# Patient Record
Sex: Female | Born: 1975 | Race: Black or African American | Hispanic: No | Smoking: Current every day smoker
Health system: Southern US, Community
[De-identification: ages and names within clinical notes are randomized; demographics above are authoritative.]

## PROBLEM LIST (undated history)

## (undated) DIAGNOSIS — R519 Headache, unspecified: Secondary | ICD-10-CM

## (undated) DIAGNOSIS — G43019 Migraine without aura, intractable, without status migrainosus: Principal | ICD-10-CM

## (undated) DIAGNOSIS — R51 Headache: Secondary | ICD-10-CM

## (undated) DIAGNOSIS — N289 Disorder of kidney and ureter, unspecified: Secondary | ICD-10-CM

## (undated) DIAGNOSIS — Z87442 Personal history of urinary calculi: Secondary | ICD-10-CM

## (undated) HISTORY — DX: Migraine without aura, intractable, without status migrainosus: G43.019

## (undated) HISTORY — DX: Headache, unspecified: R51.9

## (undated) HISTORY — DX: Headache: R51

---

## 1998-08-11 ENCOUNTER — Emergency Department (HOSPITAL_COMMUNITY): Admission: EM | Admit: 1998-08-11 | Discharge: 1998-08-11 | Payer: Self-pay | Admitting: Emergency Medicine

## 1999-02-22 ENCOUNTER — Other Ambulatory Visit: Admission: RE | Admit: 1999-02-22 | Discharge: 1999-02-22 | Payer: Self-pay | Admitting: Obstetrics and Gynecology

## 1999-06-26 ENCOUNTER — Inpatient Hospital Stay (HOSPITAL_COMMUNITY): Admission: AD | Admit: 1999-06-26 | Discharge: 1999-06-26 | Payer: Self-pay | Admitting: Obstetrics and Gynecology

## 1999-06-29 ENCOUNTER — Inpatient Hospital Stay (HOSPITAL_COMMUNITY): Admission: AD | Admit: 1999-06-29 | Discharge: 1999-06-29 | Payer: Self-pay | Admitting: Obstetrics & Gynecology

## 1999-07-15 ENCOUNTER — Inpatient Hospital Stay (HOSPITAL_COMMUNITY): Admission: AD | Admit: 1999-07-15 | Discharge: 1999-07-17 | Payer: Self-pay | Admitting: Obstetrics & Gynecology

## 1999-07-15 ENCOUNTER — Encounter (INDEPENDENT_AMBULATORY_CARE_PROVIDER_SITE_OTHER): Payer: Self-pay

## 2000-08-30 ENCOUNTER — Other Ambulatory Visit: Admission: RE | Admit: 2000-08-30 | Discharge: 2000-08-30 | Payer: Self-pay | Admitting: Obstetrics and Gynecology

## 2001-02-01 ENCOUNTER — Inpatient Hospital Stay (HOSPITAL_COMMUNITY): Admission: AD | Admit: 2001-02-01 | Discharge: 2001-02-01 | Payer: Self-pay | Admitting: Obstetrics and Gynecology

## 2001-02-14 ENCOUNTER — Inpatient Hospital Stay (HOSPITAL_COMMUNITY): Admission: AD | Admit: 2001-02-14 | Discharge: 2001-02-14 | Payer: Self-pay | Admitting: Obstetrics and Gynecology

## 2001-02-16 ENCOUNTER — Encounter (INDEPENDENT_AMBULATORY_CARE_PROVIDER_SITE_OTHER): Payer: Self-pay | Admitting: Specialist

## 2001-02-16 ENCOUNTER — Inpatient Hospital Stay (HOSPITAL_COMMUNITY): Admission: AD | Admit: 2001-02-16 | Discharge: 2001-02-19 | Payer: Self-pay | Admitting: Obstetrics and Gynecology

## 2001-09-14 ENCOUNTER — Emergency Department (HOSPITAL_COMMUNITY): Admission: EM | Admit: 2001-09-14 | Discharge: 2001-09-14 | Payer: Self-pay | Admitting: Emergency Medicine

## 2001-09-14 ENCOUNTER — Encounter: Payer: Self-pay | Admitting: Emergency Medicine

## 2004-08-16 ENCOUNTER — Emergency Department (HOSPITAL_COMMUNITY): Admission: EM | Admit: 2004-08-16 | Discharge: 2004-08-16 | Payer: Self-pay | Admitting: Emergency Medicine

## 2005-07-09 ENCOUNTER — Emergency Department (HOSPITAL_COMMUNITY): Admission: EM | Admit: 2005-07-09 | Discharge: 2005-07-09 | Payer: Self-pay | Admitting: Emergency Medicine

## 2008-04-08 ENCOUNTER — Emergency Department (HOSPITAL_COMMUNITY): Admission: EM | Admit: 2008-04-08 | Discharge: 2008-04-08 | Payer: Self-pay | Admitting: *Deleted

## 2009-09-29 ENCOUNTER — Emergency Department (HOSPITAL_COMMUNITY): Admission: EM | Admit: 2009-09-29 | Discharge: 2009-09-29 | Payer: Self-pay | Admitting: Emergency Medicine

## 2010-05-04 LAB — POCT I-STAT, CHEM 8
BUN: 7 mg/dL (ref 6–23)
Calcium, Ion: 1.12 mmol/L (ref 1.12–1.32)
Chloride: 109 mEq/L (ref 96–112)
Creatinine, Ser: 0.6 mg/dL (ref 0.4–1.2)
Glucose, Bld: 135 mg/dL — ABNORMAL HIGH (ref 70–99)
HCT: 42 % (ref 36.0–46.0)
Hemoglobin: 14.3 g/dL (ref 12.0–15.0)
Potassium: 3.5 mEq/L (ref 3.5–5.1)
Sodium: 140 mEq/L (ref 135–145)
TCO2: 22 mmol/L (ref 0–100)

## 2010-06-09 NOTE — H&P (Signed)
[facility]  Patient:    Barbara Caldwell, Barbara Caldwell                         MRN: [id number] Adm. Date:  [id number] Disc. Date: [id number] Attending:  Cleatrice Burke Dictator:   Erin Sons, C.N.M.                         History and Physical  DATE OF BIRTH:  01/29/1975  HISTORY OF PRESENT ILLNESS:   Ms. Weedon is a 35 year old, gravida 4, para 2-0-1-2, at 36-6/7 weeks who presents with uterine contractions every one to three minutes x 0 hours.  She denies any leaking or bleeding and reports positive fetal movement.  Pregnancy has remarkable for: (1) Positive group B strep, (2) Previous smoker.  PRENATAL LABORATORY DATA:     Blood type is O positive.  Rh antibody negative. VDRL nonreactive.  Rubella titer positive.  Hepatitis B surface antigen negative.  Sickle cell test negative.  GC and Chlamydia cultures were negative.  Pap was normal.  HIV was declined.  AFP was declined.  Glucose challenge was normal.  She did have a positive urine culture on initially urine sample in the first trimester.  Hemoglobin upon entry into practice was 12.2, it was 10.3 at 30 weeks.  EDC of August 06, 1999, was established by ultrasound at approximately 17 weeks.  At that time she did have a placenta previa but it did resolve itself.  This was repeated at 26 weeks and was negative.  Group B strep culture was positive at 36 weeks.  HISTORY OF PRESENT PREGNANCY:                    The patient entered care at approximately 14 weeks.  She had a positive beta strep diagnosed on urine culture.  She had BD at that time.  She had an ultrasound at 17 weeks and at 26 weeks to follow up on the resolved previa.  Estimated fetal weight remained within normal limits throughout those ultrasounds.  The rest of her pregnancy was essentially uncomplicated.  OBSTETRICAL HISTORY:          In 1995, she had a vaginal birth of a female infant, weight 6 pounds 9 ounces at [redacted] weeks gestation.   She was in labor approximately 16 hours.  She had epidural anesthesia.  During that pressure, she did have decreased amniotic fluid volume.  In 1996 she had a first trimester of spontaneous miscarriage with a D&C.  In 1998 she had a vaginal birth of a female infant, weight 6 pounds 11 ounces at [redacted] weeks gestation. She was in labor approximately 6 hours.  She had epidural anesthesia.  She was induced for post dates.  There was a nuchal cord noted.  She had anemia during those previous pregnancies.  She also had yeast infections during those pregnancies.  PAST MEDICAL HISTORY:         She was treated for syphilis in 1990.  She reports the usual childhood illnesses.  She had a UTI during her 1995 pregnancy.  She was a smoker prior to pregnancy.  At age 67 she cracked her pelvis with a fall.  Her only other surgery was a 1996 D&C.  FAMILY HISTORY:               Her cousin had pregnancy-induced hypertension. Her maternal uncles have heart disease and are  now deceased.  Her father has hypertension.  Her sister has anemia.  Her nephews have asthma.  Her maternal aunt has insulin-dependent diabetes.  Her maternal cousin has thyroid problems.  Maternal grandmother had cervical cancer.  Maternal aunt had breast cancer.  GENETIC HISTORY:              Remarkable for patient being a fraternal twin.  SOCIAL HISTORY:               The patient is separated from her husband.  The father of the baby is involved.  She is African-American, of the [place] faith.  She has been followed by the Certified Nurse-Midwife [facility].  She denies any alcohol or drug use during this pregnancy.  She was a smoker prior to her urine pregnancy test.  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  PHYSICAL EXAMINATION:  VITAL SIGNS:                  Vital signs are stable.  The patient is afebrile.  HEENT:                        Within normal limits.  LUNGS:                         Bilateral breath sounds are clear.  HEART:                        Regular rate and rhythm without murmur.  BREASTS:                      Soft and nontender.  ABDOMEN:                      Fundal height is approximately 37 cm.  Estimated fetal weight is 6 to 6-1/2 pounds.  Uterine contractions are every 3-5 minutes, mild to moderate quality.  PELVIC:                       Cervical exam is 3-4 cm, 80% vertex at a -1 station with bulging bag of waters.  This is a change from 2 cm early in the office this week.  EXTREMITIES:                  Deep tendon reflexes are 2+ without clonus. There is a trace edema noted.  Fetal heart rate is reactive with no decelerations. ASSESSMENT:                   1. Intrauterine pregnancy, at 36-6/7 weeks.                               2. Early labor.                               3. Positive group B streptococcus.  PLAN:                         1. Admit to birthing suite for consult with  Dr. Cleatrice Burke, who is attending                                  physician.                               2. Routine Certified Nurse-Midwife orders.                               3. Plan group B streptococcus prophylaxis of                                  penicillin G per standard dosing.                               4. Anticipate normal spontaneous vaginal                                  birth. DD:  07/15/99 TD:  07/15/99 Job: [id number]

## 2010-06-09 NOTE — Op Note (Signed)
[facility]  Patient:    KEARI, MIU Visit Number: [id number] MRN: [id number]          Service Type: OBS Location: 9400 [id number] Attending Physician:  Jaymes Graff A Dictated by:   Pierre Bali. Normand Sloop, M.D. Proc. Date: 02/16/01 Admit Date:  02/16/2001                             Operative Report  PREOPERATIVE DIAGNOSIS:       Placental abruption.  POSTOPERATIVE DIAGNOSIS:      Placental abruption.  PROCEDURE:                    Stat low transverse cesarean section.  SURGEON:                      Naima A. Normand Sloop, M.D.  ASSISTANT:                    Caralyn Guile. Arlyce Dice, M.D.  ANESTHESIA:                   General endotracheal.  ESTIMATED BLOOD LOSS:         2000 cc.  IV FLUIDS:                    4000 cc crystalloid.  URINE OUTPUT:                 100 cc clear urine at the end of the procedure.  FINDINGS:                     Stillborn female fetus with Apgars of 0, 0 and 0.  Approximately 1000 cc of blood clot seen in the uterus consistent with abruption.  Normal tubes and ovaries.  Normal abdominal anatomy.  PROCEDURE IN DETAIL:          The patient was taken to the operating room, where she was given general anesthesia.  Stat prep was done and a Pfannenstiel skin incision was then made and carried down to the fascia using a knife.  The fascia was then incised in the midline and extended bilaterally.  The rectus muscles were then separated.  The peritoneum was identified, tented up and entered sharply.  A bladder blade was inserted.  A primary low transverse uterine incision was made with a knife and extended bluntly.  The infant was delivered.  All this was done by Dr. Arlyce Dice, and I was able to make it to the hospital to assist.  The placenta was already separated and delivered easily. The uterus was cleared of all clot and debris.  The uterine incision was repaired with 0 Vicryl in a running lock fashion.  Hemostasis was assured.  The abdomen was irrigated with copious normal saline.  The fascia was closed with 0 Vicryl in a running fashion.  The skin was closed with staples.  Sponge, lap, and needle counts were correct x 2.  The patient went to the recovery room in stable condition. Dictated by:   Pierre Bali. Normand Sloop, M.D. Attending Physician:  Michael Litter DD:  02/16/01 TD:  02/17/01 Job: [id number]

## 2010-06-09 NOTE — Discharge Summary (Signed)
[facility]  Patient:    Barbara Caldwell, Barbara Caldwell Visit Number: [id number] MRN: [id number]          Service Type: OBS Location: 9300 [id number] Attending Physician:  Jaymes Graff A Dictated by:   Mack Guise, C.N.M. Admit Date:  02/16/2001 Discharge Date: 02/19/2001                             Discharge Summary  ADMISSION DIAGNOSES: 1. Intrauterine pregnancy at 35-4/7 weeks. 2. Abruptio placenta. 3. IUFD.  PROCEDURE:  Primary low transverse cesarean section stat.  DISCHARGE DIAGNOSES: 1. Intrauterine pregnancy at 35-4/7 weeks. 2. Abruptio placenta. 3. IUFD. 4. Diffuse intravascular coagulation.  HISTORY OF PRESENT ILLNESS AND HOSPITAL COURSE:  Barbara Caldwell is a 35 year old African-American, gravida 5, para 3-0-1-3, who presented by EMS at 35-4/7 weeks with severe abdominal pain and a rigid abdomen.  Fetal distress was noted, and it was decided to proceed with stat low transverse cesarean section.  The baby was delivered by Dr. Arlyce Dice who answered the call for any obstetrician to come to triage as soon as possible.  A stillborn fetus was found with approximately 1000 cc of blood and clots in the uterus, consistent with abruption.  The patient has done as well as can be expected in the immediate postoperative period.  Her blood values indicated DIC, PT was 25.6, INR 3.1, PTT 55.1.  Her hemoglobin was 5.4, and decision was made to give 2 units of red blood cells and 4 units of fresh frozen plasma.  The patient was followed very closely, and did receive 2 further units of packed red blood cells.  Her blood values then fell into the normal range by the next postoperative day.  Her white blood cell count came down to 13.2 from 21.6. Her hemoglobin stabilized at 7.6.  Platelets on that day were 85,000, and on the day of discharge were 124,000.  PT stabilized at 14.0, INR at 1.1, PTT 30, fibrinogen 317, and D-dimer 10.45.  Her vital signs have remained  stable.  She is afebrile.  She is ambulating without difficulty, and states her desire to be discharged home.  On her third postoperative day she is judged to be in satisfactory condition for discharge.  LABORATORY DATA:  Her urine drug screen was found to be positive for benzodiazepines, barbituates, and cannabinoids.  DISCHARGE INSTRUCTIONS:  Per [facility] handout.  DISCHARGE MEDICATIONS: 1. Motrin 600 mg p.o. q.6h. p.r.n. pain. 2. Tylox one or two p.o. q.3-4h. p.r.n. pain. 3. Prenatal vitamins.  FOLLOWUP:  [facility] in four weeks, and plans a bilateral tubal sterilization at six weeks. Dictated by:   Mack Guise, C.N.M. Attending Physician:  Michael Litter DD:  02/19/01 TD:  02/19/01 Job: [id number]

## 2010-06-09 NOTE — H&P (Signed)
[facility]  Patient:    PHEBE, DETTMER Visit Number: [id number] MRN: [id number]          Service Type: OBS Location: [phone] Attending Physician:  Jaymes Graff A Dictated by:   Pierre Bali. Normand Sloop, M.D. Admit Date:  02/16/2001                           History and Physical  HISTORY OF PRESENT ILLNESS:   The patient is a 35 year old African American female, G5, P3-0-1-3, whose last menstrual period was May 31, 2000, with a due date of March 20, 2001, at 35-4/7th weeks who presented by EMS with a blood pressure of 90/50, severe abdominal pain, and rigid abdomen.  I was paged because I was not at the hospital at the time, and I asked for the nurses to call for the first available physician.  Dr. Arlyce Dice responded to the call and said that he saw the patient, and did break the water, and found it was clear, but had a heart beat of 70 which could be maternal, and then proceeded with an ultrasound which looked like the baby had _______ heart beat.  At this point, it was decided to proceed with a stat low transverse cesarean section.  The baby was already delivered by Dr. Arlyce Dice, and I arrived and proceed to help him repair the uterus.  PAST MEDICAL HISTORY:         The patient has no significant past medical or surgical history.  ALLERGIES:                    No known drug allergies.  MEDICATIONS:                  Include prenatal vitamins and Ambien.  SOCIAL HISTORY:               Significant for tobacco use.  She denies any drug use.  PAST OB HISTORY:              Significant for full-term vaginal delivery x3 without any complications and one miscarriage in 1996, in which she had a D&E.  PRENATAL LABORATORY DATA:     Were 12.7 and 38.5 hemoglobin, platelets are 358.  Blood type is O-positive.  Antibody negative.  She is rubella immune. RPR was nonreactive.  Hepatitis B surface antigen was negative.  Pap was within normal range.  GC and  Chlamydia negative.  A one hour glucola was 64.  PHYSICAL EXAMINATION:  VITAL SIGNS:                  The patients blood pressure now is 116/51, pulse 89, temperature is 32 degrees.  GENERAL:                      The patient is in mild distress.  LUNGS:                        Clear.  ABDOMEN:                      Soft and nontender.  EXTREMITIES:                  Had no cyanosis, clubbing, or edema.  ASSESSMENT:  Her stat hemoglobin sent was 8.2.  The patient will be sent to the _____ instead of the ______ and complete metabolic panel, two units of blood will be typed and crossed in case she needs blood transfusion.  We sent for a urine drug screen.  The patient consented to the procedure and morphine PCA.  The patient was told to monitor closely and a Bear Hugger was placed to monitor her temperature. Dictated by:   Pierre Bali. Normand Sloop, M.D. Attending Physician:  Michael Litter DD:  02/16/01 TD:  02/16/01 Job: [id number]

## 2012-12-04 ENCOUNTER — Emergency Department (HOSPITAL_COMMUNITY): Payer: Medicaid Other

## 2012-12-04 ENCOUNTER — Emergency Department (HOSPITAL_COMMUNITY)
Admission: EM | Admit: 2012-12-04 | Discharge: 2012-12-04 | Disposition: A | Discharge status: Home / Self Care | Payer: Medicaid Other | Attending: Emergency Medicine | Admitting: Emergency Medicine

## 2012-12-04 ENCOUNTER — Encounter (HOSPITAL_COMMUNITY): Payer: Self-pay | Admitting: Emergency Medicine

## 2012-12-04 DIAGNOSIS — M25552 Pain in left hip: Secondary | ICD-10-CM

## 2012-12-04 DIAGNOSIS — S6390XA Sprain of unspecified part of unspecified wrist and hand, initial encounter: Secondary | ICD-10-CM | POA: Insufficient documentation

## 2012-12-04 DIAGNOSIS — S6391XA Sprain of unspecified part of right wrist and hand, initial encounter: Secondary | ICD-10-CM

## 2012-12-04 DIAGNOSIS — F17200 Nicotine dependence, unspecified, uncomplicated: Secondary | ICD-10-CM | POA: Insufficient documentation

## 2012-12-04 DIAGNOSIS — Y929 Unspecified place or not applicable: Secondary | ICD-10-CM | POA: Insufficient documentation

## 2012-12-04 DIAGNOSIS — T148XXA Other injury of unspecified body region, initial encounter: Secondary | ICD-10-CM

## 2012-12-04 DIAGNOSIS — M25559 Pain in unspecified hip: Secondary | ICD-10-CM | POA: Insufficient documentation

## 2012-12-04 DIAGNOSIS — Y9389 Activity, other specified: Secondary | ICD-10-CM | POA: Insufficient documentation

## 2012-12-04 DIAGNOSIS — X500XXA Overexertion from strenuous movement or load, initial encounter: Secondary | ICD-10-CM | POA: Insufficient documentation

## 2012-12-04 DIAGNOSIS — Y990 Civilian activity done for income or pay: Secondary | ICD-10-CM | POA: Insufficient documentation

## 2012-12-04 MED ORDER — METHOCARBAMOL 500 MG PO TABS: 500.0000 mg | ORAL_TABLET | Freq: Two times a day (BID) | ORAL | Status: DC

## 2012-12-04 NOTE — ED Notes (Signed)
Pt c/o of right hand pain. Swelling unable to make a fist. Also c/o of left hip pain, sharp 10/10 sudden. Denies injury.

## 2012-12-04 NOTE — ED Provider Notes (Signed)
  Medical screening examination/treatment/procedure(s) were performed by non-physician practitioner and as supervising physician I was immediately available for consultation/collaboration.      Gerhard Munch, MD 12/04/12 2010

## 2012-12-04 NOTE — ED Provider Notes (Signed)
CSN: [id number]     Arrival date & time 12/04/12  1517 History   First MD Initiated Contact with Patient 12/04/12 1530     Chief Complaint  Patient presents with  . Hip Pain  . Hand Pain   (Consider location/radiation/quality/duration/timing/severity/associated sxs/prior Treatment) HPI Comments: Patient is a 37 year old healthy female who presents to the emergency department complaining of right hand pain intermittently x2 months, worsening over the past 2 days. Patient states 2 months back, she lifted a heavy box at work causing hand pain, 2 days ago again while at work and lifting a box she felt her hand pop and pull, has been swelling since. Pain worse with grabbing or putting pressure on her index and middle finger. She has not tried any alleviating factors for her pain. Also complaining of left hip pain x2 days. No known injury or trauma. Describes the pain as sharp, occasionally radiating down the front about halfway down her thigh rated 10/10. Laying on her left side makes the pain worse. She has not tried any alleviating factors.denies back pain or injury, difficulty walking, pelvic pain, fever, chills, night sweats, numbness or tingling.  Patient is a 37 y.o. female presenting with hip pain and hand pain. The history is provided by the patient.  Hip Pain Associated symptoms include joint swelling.  Hand Pain Associated symptoms include joint swelling.    History reviewed. No pertinent past medical history. History reviewed. No pertinent past surgical history. No family history on file. History  Substance Use Topics  . Smoking status: Current Every Day Smoker  . Smokeless tobacco: Not on file  . Alcohol Use: No   OB History   Grav Para Term Preterm Abortions TAB SAB Ect Mult Living                 Review of Systems  Musculoskeletal: Positive for joint swelling.       Positive for right hand and left hip pain.  All other systems reviewed and are negative.    Allergies   Review of patient's allergies indicates no known allergies.  Home Medications   Current Outpatient Rx  Name  Route  Sig  Dispense  Refill  . ibuprofen (ADVIL,MOTRIN) 200 MG tablet   Oral   Take 600 mg by mouth every 6 (six) hours as needed.          BP 125/83  Pulse 69  Temp(Src) 98.6 F (37 C) (Oral)  Resp 18  SpO2 99% Physical Exam  Nursing note and vitals reviewed. Constitutional: She is oriented to person, place, and time. She appears well-developed and well-nourished. No distress.  HENT:  Head: Normocephalic and atraumatic.  Mouth/Throat: Oropharynx is clear and moist.  Eyes: Conjunctivae are normal.  Neck: Normal range of motion. Neck supple.  Cardiovascular: Normal rate, regular rhythm, normal heart sounds and intact distal pulses.   Capillary refill < 3 seconds.  Pulmonary/Chest: Effort normal and breath sounds normal.  Abdominal: Soft. Bowel sounds are normal. She exhibits no distension. There is no tenderness.  Musculoskeletal: Normal range of motion.  TTP of right 2nd and 3rd metatarsals with mild swelling, no deformity, full ROM or right hand and wrist. No snuffbox tenderness. TTP of lateral insertion of left quadriceps muscle. No edema. Normal gait. Full L hip ROM, minimal pain. No lumbar spine or muscular tenderness.  Neurological: She is alert and oriented to person, place, and time.  Sensation intact.  Skin: Skin is warm and dry. She is not diaphoretic.  Psychiatric: She has a normal mood and affect. Her behavior is normal.    ED Course  Procedures (including critical care time) Labs Review Labs Reviewed - No data to display Imaging Review Dg Hand Complete Right  12/04/2012   CLINICAL DATA:  Right hand pain  EXAM: RIGHT HAND - COMPLETE 3+ VIEW  COMPARISON:  None.  FINDINGS: There is no evidence of fracture or dislocation. There is no evidence of arthropathy or other focal bone abnormality. Soft tissues are unremarkable.  IMPRESSION: No acute  abnormality noted.   Electronically Signed   By: Alcide Clever M.D.   On: 12/04/2012 16:14    EKG Interpretation   None       MDM   1. Hand sprain, right, initial encounter   2. Hip pain, left   3. Muscle strain    Xrays without acute finding. Neurovascularly intact. No back pain, neuro deficits, abdominal pain. Ambulating without difficulty. F/u with PCP. NSAIDs, robaxin, ice/heat. Return precautions given. Patient states understanding of treatment care plan and is agreeable.    Trevor Mace, PA-C 12/04/12 [phone]

## 2013-02-05 ENCOUNTER — Ambulatory Visit
Admission: RE | Admit: 2013-02-05 | Discharge: 2013-02-05 | Disposition: A | Discharge status: Home / Self Care | Payer: Medicaid Other | Source: Ambulatory Visit | Attending: Family Medicine | Admitting: Family Medicine

## 2013-02-05 ENCOUNTER — Other Ambulatory Visit: Payer: Self-pay | Admitting: Family Medicine

## 2013-02-05 DIAGNOSIS — W19XXXA Unspecified fall, initial encounter: Secondary | ICD-10-CM

## 2013-03-05 ENCOUNTER — Emergency Department (HOSPITAL_COMMUNITY)
Admission: EM | Admit: 2013-03-05 | Discharge: 2013-03-05 | Disposition: A | Discharge status: Home / Self Care | Payer: Medicaid Other | Attending: Emergency Medicine | Admitting: Emergency Medicine

## 2013-03-05 ENCOUNTER — Encounter (HOSPITAL_COMMUNITY): Payer: Self-pay | Admitting: Emergency Medicine

## 2013-03-05 DIAGNOSIS — F17200 Nicotine dependence, unspecified, uncomplicated: Secondary | ICD-10-CM | POA: Insufficient documentation

## 2013-03-05 DIAGNOSIS — G43909 Migraine, unspecified, not intractable, without status migrainosus: Secondary | ICD-10-CM | POA: Insufficient documentation

## 2013-03-05 MED ORDER — METOCLOPRAMIDE HCL 5 MG/ML IJ SOLN
10.0000 mg | Freq: Once | INTRAMUSCULAR | Status: AC
  Administered 2013-03-05: 10 mg via INTRAVENOUS
  Filled 2013-03-05: qty 2

## 2013-03-05 MED ORDER — DIPHENHYDRAMINE HCL 50 MG/ML IJ SOLN
25.0000 mg | Freq: Once | INTRAMUSCULAR | Status: AC
  Administered 2013-03-05: 25 mg via INTRAVENOUS
  Filled 2013-03-05: qty 1

## 2013-03-05 MED ORDER — MAGNESIUM SULFATE 40 MG/ML IJ SOLN
2.0000 g | Freq: Once | INTRAMUSCULAR | Status: AC
  Administered 2013-03-05: 2 g via INTRAVENOUS
  Filled 2013-03-05: qty 50

## 2013-03-05 MED ORDER — DEXAMETHASONE SODIUM PHOSPHATE 4 MG/ML IJ SOLN
10.0000 mg | Freq: Once | INTRAMUSCULAR | Status: AC
  Administered 2013-03-05: 10 mg via INTRAVENOUS
  Filled 2013-03-05 (×2): qty 3

## 2013-03-05 MED ORDER — SODIUM CHLORIDE 0.9 % IV BOLUS (SEPSIS)
1000.0000 mL | Freq: Once | INTRAVENOUS | Status: AC
  Administered 2013-03-05: 1000 mL via INTRAVENOUS

## 2013-03-05 NOTE — ED Provider Notes (Signed)
CSN: [id number]     Arrival date & time 03/05/13  0441 History   First MD Initiated Contact with Patient 03/05/13 [phone]     Chief Complaint  Patient presents with  . Migraine     (Consider location/radiation/quality/duration/timing/severity/associated sxs/prior Treatment) HPI History provided by patient. Has history of migraine headaches and will this morning with gradual onset migraine worse or lasts a few hours associated nausea. Some photo phobia. Pain radiates from right temporal area of her right. No neck stiffness. Pain is throbbing in quality moderate to severe. Feels like a typical migraine for her. No fevers or chills. No weakness or numbness. History reviewed. No pertinent past medical history. History reviewed. No pertinent past surgical history. No family history on file. History  Substance Use Topics  . Smoking status: Current Every Day Smoker  . Smokeless tobacco: Not on file  . Alcohol Use: Yes     Comment: daily   OB History   Grav Para Term Preterm Abortions TAB SAB Ect Mult Living                 Review of Systems    Allergies  Review of patient's allergies indicates no known allergies.  Home Medications   Current Outpatient Rx  Name  Route  Sig  Dispense  Refill  . ibuprofen (ADVIL,MOTRIN) 200 MG tablet   Oral   Take 600 mg by mouth every 6 (six) hours as needed.          BP 146/97  Pulse 77  Temp(Src) 97.5 F (36.4 C) (Oral)  Resp 16  Ht 5\' 2"  (1.575 m)  Wt 130 lb (58.968 kg)  BMI 23.77 kg/m2  SpO2 97%  LMP 03/02/2013 Physical Exam  Constitutional: She is oriented to person, place, and time. She appears well-developed and well-nourished.  HENT:  Head: Normocephalic and atraumatic.  No tenderness over TMJ or temporal artery  Eyes: Conjunctivae and EOM are normal. Pupils are equal, round, and reactive to light.  Neck: Full passive range of motion without pain. Neck supple. No thyromegaly present.  No meningismus  Cardiovascular: Normal  rate, regular rhythm, S1 normal, S2 normal and intact distal pulses.   Pulmonary/Chest: Effort normal and breath sounds normal. No respiratory distress. She exhibits no tenderness.  Abdominal: Soft. Bowel sounds are normal. She exhibits no distension. There is no tenderness. There is no CVA tenderness.  Musculoskeletal: Normal range of motion. She exhibits no edema.  Neurological: She is alert and oriented to person, place, and time. She has normal strength and normal reflexes. No cranial nerve deficit or sensory deficit. She displays a negative Romberg sign. GCS eye subscore is 4. GCS verbal subscore is 5. GCS motor subscore is 6.  Normal Gait  Skin: Skin is warm and dry. No rash noted. No cyanosis. Nails show no clubbing.  Psychiatric: She has a normal mood and affect. Her speech is normal and behavior is normal.    ED Course  Procedures (including critical care time) Labs Review Labs Reviewed - No data to display Imaging Review No results found.  IV fluids and IV headache cocktail provided: Benadryl, Decadron, Reglan, magnesium  On recheck headache improved, requesting a little more pain medication. IV Reglan repeated.  Patient feels comfortable to plan discharge home, followup primary care physician. Return precautions verbalizes understood. MDM   Diagnosis: Headache, history of migraines  No neuro deficits or indications for CT head or LP Improved with medications/ serial evaluations    Sunnie NielsenBrian Leora Platt, MD 03/05/13  0748 

## 2013-03-05 NOTE — ED Notes (Signed)
Pt c/o R sided HA onset 0100, radiating into neck. +n/v. +photosensitivity

## 2013-03-05 NOTE — ED Notes (Signed)
Pt states woke up at 0100 with a migraine, hx of migraines, states having n/v, sensitivity to light, denies sensitivity to sound.

## 2013-03-05 NOTE — Discharge Instructions (Signed)
Migraine Headache A migraine headache is an intense, throbbing pain on one or both sides of your head. A migraine can last for 30 minutes to several hours. CAUSES  The exact cause of a migraine headache is not always known. However, a migraine may be caused when nerves in the brain become irritated and release chemicals that cause inflammation. This causes pain. Certain things may also trigger migraines, such as:  Alcohol.  Smoking.  Stress.  Menstruation.  Aged cheeses.  Foods or drinks that contain nitrates, glutamate, aspartame, or tyramine.  Lack of sleep.  Chocolate.  Caffeine.  Hunger.  Physical exertion.  Fatigue.  Medicines used to treat chest pain (nitroglycerine), birth control pills, estrogen, and some blood pressure medicines. SIGNS AND SYMPTOMS  Pain on one or both sides of your head.  Pulsating or throbbing pain.  Severe pain that prevents daily activities.  Pain that is aggravated by any physical activity.  Nausea, vomiting, or both.  Dizziness.  Pain with exposure to bright lights, loud noises, or activity.  General sensitivity to bright lights, loud noises, or smells. Before you get a migraine, you may get warning signs that a migraine is coming (aura). An aura may include:  Seeing flashing lights.  Seeing bright spots, halos, or zig-zag lines.  Having tunnel vision or blurred vision.  Having feelings of numbness or tingling.  Having trouble talking.  Having muscle weakness. DIAGNOSIS  A migraine headache is often diagnosed based on:  Symptoms.  Physical exam.  A CT scan or MRI of your head. These imaging tests cannot diagnose migraines, but they can help rule out other causes of headaches. TREATMENT Medicines may be given for pain and nausea. Medicines can also be given to help prevent recurrent migraines.  HOME CARE INSTRUCTIONS  Only take over-the-counter or prescription medicines for pain or discomfort as directed by your  health care provider. The use of long-term narcotics is not recommended.  Lie down in a dark, quiet room when you have a migraine.  Keep a journal to find out what may trigger your migraine headaches. For example, write down:  What you eat and drink.  How much sleep you get.  Any change to your diet or medicines.  Limit alcohol consumption.  Quit smoking if you smoke.  Get 7 9 hours of sleep, or as recommended by your health care provider.  Limit stress.  Keep lights dim if bright lights bother you and make your migraines worse. SEEK IMMEDIATE MEDICAL CARE IF:   Your migraine becomes severe.  You have a fever.  You have a stiff neck.  You have vision loss.  You have muscular weakness or loss of muscle control.  You start losing your balance or have trouble walking.  You feel faint or pass out.  You have severe symptoms that are different from your first symptoms. MAKE SURE YOU:   Understand these instructions.  Will watch your condition.  Will get help right away if you are not doing well or get worse. Document Released: 01/08/2005 Document Revised: 10/29/2012 Document Reviewed: 09/15/2012 [employer] Patient Information 2014 [employer].  

## 2013-04-02 ENCOUNTER — Encounter: Payer: Self-pay | Admitting: Neurology

## 2013-04-06 ENCOUNTER — Encounter: Payer: Self-pay | Admitting: Neurology

## 2013-04-06 ENCOUNTER — Ambulatory Visit (INDEPENDENT_AMBULATORY_CARE_PROVIDER_SITE_OTHER): Payer: Medicaid Other | Admitting: Neurology

## 2013-04-06 ENCOUNTER — Encounter (INDEPENDENT_AMBULATORY_CARE_PROVIDER_SITE_OTHER): Payer: Self-pay

## 2013-04-06 VITALS — BP 144/104 | HR 89 | Ht 63.0 in | Wt 136.5 lb

## 2013-04-06 DIAGNOSIS — G43019 Migraine without aura, intractable, without status migrainosus: Secondary | ICD-10-CM

## 2013-04-06 HISTORY — DX: Migraine without aura, intractable, without status migrainosus: G43.019

## 2013-04-06 MED ORDER — TOPIRAMATE 25 MG PO TABS: ORAL_TABLET | ORAL | Status: DC

## 2013-04-06 MED ORDER — PREDNISONE 5 MG PO TABS: ORAL_TABLET | ORAL | Status: DC

## 2013-04-06 NOTE — Progress Notes (Signed)
Reason for visit: Headache  Barbara Caldwell is a 38 y.o. female  History of present illness:  Ms. Barbara Caldwell is a 38 year old right-handed black female with a history of migraine headache dating back 8 years. The patient indicates that her usual headaches are usually on the right side of the head. Within the last 2 weeks, the headaches have become very frequent, and occurring 2 or 3 times daily, lasting up to 4 hours. The patient has had some nausea with the headache, and this morning, the patient had onset of some right hand and right foot numbness and tingling. The patient notes photophobia with the headache, with a pounding sensation in the head on the right side. The patient denies photophobia. The patient has scalp tenderness with the headache. The patient may feel weak all over with the headache. The patient does not note any particular activating factors for the headache. The patient has been placed on Imitrex, but she has required this medication frequently. The patient comes to this office for further evaluation.  Past Medical History  Diagnosis Date  . Headache   . Migraine without aura, with intractable migraine, so stated, without mention of status migrainosus 04/06/2013    Past Surgical History  Procedure Laterality Date  . Cesarean section  2004    Family History  Problem Relation Age of Onset  . Hypertension Mother   . Cataracts Mother   . Hypertension Father   . Gout Father   . Heart disease Father   . Migraines Cousin     Social history:  reports that she has been smoking.  She has never used smokeless tobacco. She reports that she drinks alcohol. She reports that she does not use illicit drugs.  Medications:  Current Outpatient Prescriptions on File Prior to Visit  Medication Sig Dispense Refill  . ibuprofen (ADVIL,MOTRIN) 200 MG tablet Take 600 mg by mouth every 6 (six) hours as needed.      . SUMAtriptan (IMITREX) 50 MG tablet Take 50 mg by mouth every 2 (two)  hours as needed for migraine or headache. May repeat in 2 hours if headache persists or recurs.       No current facility-administered medications on file prior to visit.     No Known Allergies  ROS:  Out of a complete 14 system review of symptoms, the patient complains only of the following symptoms, and all other reviewed systems are negative.  Fevers, chills, fatigue Ringing in the ears Eye pain Feeling hot, cold Headache, numbness Depression, change in appetite, disinterest in activities  Blood pressure 144/104, pulse 89, height 5\' 3"  (1.6 m), weight 136 lb 8 oz (61.916 kg).  Physical Exam  General: The patient is alert and cooperative at the time of the examination.  Eyes: Pupils are equal, round, and reactive to light. Discs are flat bilaterally.  Neck: The neck is supple, no carotid bruits are noted.  Respiratory: The respiratory examination is clear.  Cardiovascular: The cardiovascular examination reveals a regular rate and rhythm, no obvious murmurs or rubs are noted.  Neuromuscular: Range of movement of the cervical spine is full. No crepitus is noted in the temporomandibular joints.  Skin: Extremities are without significant edema.  Neurologic Exam  Mental status: The patient is alert and oriented x 3 at the time of the examination. The patient has apparent normal recent and remote memory, with an apparently normal attention span and concentration ability.  Cranial nerves: Facial symmetry is present. There is good sensation  of the face to pinprick and soft touch bilaterally. The strength of the facial muscles and the muscles to head turning and shoulder shrug are normal bilaterally. Speech is well enunciated, no aphasia or dysarthria is noted. Extraocular movements are full. Visual fields are full. The tongue is midline, and the patient has symmetric elevation of the soft palate. No obvious hearing deficits are noted.  Motor: The motor testing reveals 5 over 5  strength of all 4 extremities. Good symmetric motor tone is noted throughout.  Sensory: Sensory testing is intact to pinprick, soft touch, vibration sensation, and position sense on all 4 extremities. No evidence of extinction is noted.  Coordination: Cerebellar testing reveals good finger-nose-finger and heel-to-shin bilaterally.  Gait and station: Gait is normal. Tandem gait is normal. Romberg is negative. No drift is seen.  Reflexes: Deep tendon reflexes are symmetric and normal bilaterally. Toes are downgoing bilaterally.   Assessment/Plan:  One. Migraine headache  The patient has had some focal symptoms with the headache that included numbness on the right hand and foot. For this reason, MRI evaluation of the brain will be done. The patient will be placed on Topamax, and she will be given a prednisone Dosepak over the next 6 days. The patient has Imitrex to take if needed. The patient will followup through this office in 3 months.  Marlan Palau MD 04/06/2013 7:35 PM  [facility] [address] [place], [place] [address]  Phone [phone] Fax [phone]

## 2013-04-06 NOTE — Patient Instructions (Signed)
Topamax (topiramate) is a seizure medication that has an FDA approval for seizures and for migraine headache. Potential side effects of this medication include weight loss, cognitive slowing, tingling in the fingers and toes, and carbonated drinks will taste bad. If any significant side effects are noted on this drug, please contact our office.     Migraine Headache A migraine headache is an intense, throbbing pain on one or both sides of your head. A migraine can last for 30 minutes to several hours. CAUSES  The exact cause of a migraine headache is not always known. However, a migraine may be caused when nerves in the brain become irritated and release chemicals that cause inflammation. This causes pain. Certain things may also trigger migraines, such as:  Alcohol.  Smoking.  Stress.  Menstruation.  Aged cheeses.  Foods or drinks that contain nitrates, glutamate, aspartame, or tyramine.  Lack of sleep.  Chocolate.  Caffeine.  Hunger.  Physical exertion.  Fatigue.  Medicines used to treat chest pain (nitroglycerine), birth control pills, estrogen, and some blood pressure medicines. SIGNS AND SYMPTOMS  Pain on one or both sides of your head.  Pulsating or throbbing pain.  Severe pain that prevents daily activities.  Pain that is aggravated by any physical activity.  Nausea, vomiting, or both.  Dizziness.  Pain with exposure to bright lights, loud noises, or activity.  General sensitivity to bright lights, loud noises, or smells. Before you get a migraine, you may get warning signs that a migraine is coming (aura). An aura may include:  Seeing flashing lights.  Seeing bright spots, halos, or zig-zag lines.  Having tunnel vision or blurred vision.  Having feelings of numbness or tingling.  Having trouble talking.  Having muscle weakness. DIAGNOSIS  A migraine headache is often diagnosed based on:  Symptoms.  Physical exam.  A CT scan or MRI of  your head. These imaging tests cannot diagnose migraines, but they can help rule out other causes of headaches. TREATMENT Medicines may be given for pain and nausea. Medicines can also be given to help prevent recurrent migraines.  HOME CARE INSTRUCTIONS  Only take over-the-counter or prescription medicines for pain or discomfort as directed by your health care provider. The use of long-term narcotics is not recommended.  Lie down in a dark, quiet room when you have a migraine.  Keep a journal to find out what may trigger your migraine headaches. For example, write down:  What you eat and drink.  How much sleep you get.  Any change to your diet or medicines.  Limit alcohol consumption.  Quit smoking if you smoke.  Get 7 9 hours of sleep, or as recommended by your health care provider.  Limit stress.  Keep lights dim if bright lights bother you and make your migraines worse. SEEK IMMEDIATE MEDICAL CARE IF:   Your migraine becomes severe.  You have a fever.  You have a stiff neck.  You have vision loss.  You have muscular weakness or loss of muscle control.  You start losing your balance or have trouble walking.  You feel faint or pass out.  You have severe symptoms that are different from your first symptoms. MAKE SURE YOU:   Understand these instructions.  Will watch your condition.  Will get help right away if you are not doing well or get worse. Document Released: 01/08/2005 Document Revised: 10/29/2012 Document Reviewed: 09/15/2012 [employer] Patient Information 2014 [employer].  

## 2013-04-14 ENCOUNTER — Ambulatory Visit
Admission: RE | Admit: 2013-04-14 | Discharge: 2013-04-14 | Disposition: A | Discharge status: Home / Self Care | Payer: Medicaid Other | Source: Ambulatory Visit | Attending: Neurology | Admitting: Neurology

## 2013-04-14 DIAGNOSIS — G43019 Migraine without aura, intractable, without status migrainosus: Secondary | ICD-10-CM

## 2013-04-14 DIAGNOSIS — R51 Headache: Secondary | ICD-10-CM

## 2013-04-15 ENCOUNTER — Telehealth: Payer: Self-pay | Admitting: Neurology

## 2013-04-15 NOTE — Telephone Encounter (Signed)
I called the patient. 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 If the patient is amenable to a referral to an ENT physician, I will get this set up. The patient is to let us know.

## 2013-07-07 ENCOUNTER — Ambulatory Visit: Payer: Medicaid Other | Admitting: Nurse Practitioner

## 2013-07-07 ENCOUNTER — Telehealth: Payer: Self-pay | Admitting: Nurse Practitioner

## 2013-07-07 NOTE — Telephone Encounter (Signed)
Patient was no show for today's office appointment.  

## 2013-09-09 ENCOUNTER — Telehealth: Payer: Self-pay | Admitting: Adult Health

## 2013-09-09 ENCOUNTER — Ambulatory Visit: Payer: Medicaid Other | Admitting: Adult Health

## 2013-09-09 ENCOUNTER — Telehealth: Payer: Self-pay | Admitting: *Deleted

## 2013-09-09 NOTE — Telephone Encounter (Signed)
Erroneous entry please disregard

## 2013-09-09 NOTE — Telephone Encounter (Signed)
Patient no showed for a revisit appt. This is her second no show.

## 2014-03-04 ENCOUNTER — Ambulatory Visit: Payer: Medicaid Other | Admitting: Adult Health

## 2014-03-09 ENCOUNTER — Encounter: Payer: Self-pay | Admitting: Adult Health

## 2014-07-06 ENCOUNTER — Emergency Department (HOSPITAL_COMMUNITY)
Admission: EM | Admit: 2014-07-06 | Discharge: 2014-07-06 | Disposition: A | Discharge status: Home / Self Care | Payer: Medicaid Other | Attending: Emergency Medicine | Admitting: Emergency Medicine

## 2014-07-06 ENCOUNTER — Emergency Department (HOSPITAL_COMMUNITY): Payer: Medicaid Other

## 2014-07-06 ENCOUNTER — Encounter (HOSPITAL_COMMUNITY): Payer: Self-pay

## 2014-07-06 DIAGNOSIS — Y9289 Other specified places as the place of occurrence of the external cause: Secondary | ICD-10-CM | POA: Insufficient documentation

## 2014-07-06 DIAGNOSIS — S4991XA Unspecified injury of right shoulder and upper arm, initial encounter: Secondary | ICD-10-CM | POA: Insufficient documentation

## 2014-07-06 DIAGNOSIS — X58XXXA Exposure to other specified factors, initial encounter: Secondary | ICD-10-CM | POA: Insufficient documentation

## 2014-07-06 DIAGNOSIS — Z7952 Long term (current) use of systemic steroids: Secondary | ICD-10-CM | POA: Diagnosis not present

## 2014-07-06 DIAGNOSIS — Y9389 Activity, other specified: Secondary | ICD-10-CM | POA: Diagnosis not present

## 2014-07-06 DIAGNOSIS — Z720 Tobacco use: Secondary | ICD-10-CM | POA: Insufficient documentation

## 2014-07-06 DIAGNOSIS — Y998 Other external cause status: Secondary | ICD-10-CM | POA: Diagnosis not present

## 2014-07-06 DIAGNOSIS — M25511 Pain in right shoulder: Secondary | ICD-10-CM

## 2014-07-06 DIAGNOSIS — G43019 Migraine without aura, intractable, without status migrainosus: Secondary | ICD-10-CM | POA: Insufficient documentation

## 2014-07-06 MED ORDER — OXYCODONE-ACETAMINOPHEN 5-325 MG PO TABS: 2.0000 | ORAL_TABLET | ORAL | Status: DC | PRN

## 2014-07-06 MED ORDER — NAPROXEN 500 MG PO TABS: 500.0000 mg | ORAL_TABLET | Freq: Two times a day (BID) | ORAL | Status: DC

## 2014-07-06 MED ORDER — METHOCARBAMOL 500 MG PO TABS: 500.0000 mg | ORAL_TABLET | Freq: Two times a day (BID) | ORAL | Status: DC

## 2014-07-06 MED ORDER — OXYCODONE-ACETAMINOPHEN 5-325 MG PO TABS
2.0000 | ORAL_TABLET | Freq: Once | ORAL | Status: AC
  Administered 2014-07-06: 2 via ORAL
  Filled 2014-07-06: qty 2
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
## 2014-07-06 NOTE — Discharge Instructions (Signed)
Shoulder Pain Take naproxen for pain and Percocet for breakthrough pain. Follow-up with your primary care physician or orthopedics. The shoulder is the joint that connects your arms to your body. The bones that form the shoulder joint include the upper arm bone (humerus), the shoulder blade (scapula), and the collarbone (clavicle). The top of the humerus is shaped like a ball and fits into a rather flat socket on the scapula (glenoid cavity). A combination of muscles and strong, fibrous tissues that connect muscles to bones (tendons) support your shoulder joint and hold the ball in the socket. Small, fluid-filled sacs (bursae) are located in different areas of the joint. They act as cushions between the bones and the overlying soft tissues and help reduce friction between the gliding tendons and the bone as you move your arm. Your shoulder joint allows a wide range of motion in your arm. This range of motion allows you to do things like scratch your back or throw a ball. However, this range of motion also makes your shoulder more prone to pain from overuse and injury. Causes of shoulder pain can originate from both injury and overuse and usually can be grouped in the following four categories:  Redness, swelling, and pain (inflammation) of the tendon (tendinitis) or the bursae (bursitis).  Instability, such as a dislocation of the joint.  Inflammation of the joint (arthritis).  Broken bone (fracture). HOME CARE INSTRUCTIONS   Apply ice to the sore area.  Put ice in a plastic bag.  Place a towel between your skin and the bag.  Leave the ice on for 15-20 minutes, 3-4 times per day for the first 2 days, or as directed by your health care provider.  Stop using cold packs if they do not help with the pain.  If you have a shoulder sling or immobilizer, wear it as long as your caregiver instructs. Only remove it to shower or bathe. Move your arm as little as possible, but keep your hand moving to  prevent swelling.  Squeeze a soft ball or foam pad as much as possible to help prevent swelling.  Only take over-the-counter or prescription medicines for pain, discomfort, or fever as directed by your caregiver. SEEK MEDICAL CARE IF:   Your shoulder pain increases, or new pain develops in your arm, hand, or fingers.  Your hand or fingers become cold and numb.  Your pain is not relieved with medicines. SEEK IMMEDIATE MEDICAL CARE IF:   Your arm, hand, or fingers are numb or tingling.  Your arm, hand, or fingers are significantly swollen or turn white or blue. MAKE SURE YOU:   Understand these instructions.  Will watch your condition.  Will get help right away if you are not doing well or get worse. Document Released: 10/18/2004 Document Revised: 05/25/2013 Document Reviewed: 12/23/2010 [facility] Patient Information 2015 [place], [place]. This information is not intended to replace advice given to you by your health care provider. Make sure you discuss any questions you have with your health care provider.

## 2014-07-06 NOTE — ED Notes (Signed)
Pt presents with NAD- Pt reports sharp right shoulder pain that started yesterday.  Denies injury. Limited movement. Denies numbness and tingling.

## 2014-07-06 NOTE — ED Notes (Signed)
Patient transported to X-ray 

## 2014-11-15 ENCOUNTER — Encounter: Payer: Self-pay | Admitting: Adult Health

## 2014-11-15 ENCOUNTER — Ambulatory Visit (INDEPENDENT_AMBULATORY_CARE_PROVIDER_SITE_OTHER): Payer: Medicaid Other | Admitting: Adult Health

## 2014-11-15 VITALS — BP 109/73 | HR 72 | Ht 63.0 in | Wt 123.0 lb

## 2014-11-15 DIAGNOSIS — G43019 Migraine without aura, intractable, without status migrainosus: Secondary | ICD-10-CM

## 2014-11-15 MED ORDER — TOPIRAMATE 25 MG PO TABS: 100.0000 mg | ORAL_TABLET | Freq: Every day | ORAL | Status: DC

## 2014-11-15 NOTE — Progress Notes (Signed)
I have read the note, and I agree with the clinical assessment and plan.  Latonya Knight KEITH   

## 2014-11-15 NOTE — Progress Notes (Signed)
PATIENT: Barbara Caldwell DOB: March 11, 1975  REASON FOR VISIT: follow up- migraine headache HISTORY FROM: patient  HISTORY OF PRESENT ILLNESS: Ms Barbara Caldwell is a 39 year old female with a history of migraine headaches. She returns today for follow-up. The patient has not been seen in our office in 1.5 years- she no showed for her last 2 appointments. Patient reports that in the last month her headache frequency increased. She is currently taken Topamax 75 mg at bedtime. However she reports that over the summer she did not take this medication consistently as she knew her prescription was running out. She states that she has approximately 2-3 headaches a week. This weekend she states that she had a total of 4 headaches. She states that her headaches are normally located in the left frontal region. Occasionally is associated with some scalp sensitivity as well as jaw soreness. She states that she will occasionally have nausea and vomiting. Intermittent photophobia and phonophobia. She states when her headaches occur she has to lay down in a dark room. She was prescribed Imitrex by her primary care has offered her some relief. She returns today for an evaluation.  HISTORY 04/06/13 (WILLIS): Ms. Barbara Caldwell is a 39 year old right-handed black female with a history of migraine headache dating back 8 years. The patient indicates that her usual headaches are usually on the right side of the head. Within the last 2 weeks, the headaches have become very frequent, and occurring 2 or 3 times daily, lasting up to 4 hours. The patient has had some nausea with the headache, and this morning, the patient had onset of some right hand and right foot numbness and tingling. The patient notes photophobia with the headache, with a pounding sensation in the head on the right side. The patient denies photophobia. The patient has scalp tenderness with the headache. The patient may feel weak all over with the headache. The patient does not  note any particular activating factors for the headache. The patient has been placed on Imitrex, but she has required this medication frequently. The patient comes to this office for further evaluation.  REVIEW OF SYSTEMS: Out of a complete 14 system review of symptoms, the patient complains only of the following symptoms, and all other reviewed systems are negative.   Nausea, runny nose, frequent waking, snoring, neck stiffness  ALLERGIES: No Known Allergies  HOME MEDICATIONS: Outpatient Prescriptions Prior to Visit  Medication Sig Dispense Refill  . ibuprofen (ADVIL,MOTRIN) 200 MG tablet Take 600 mg by mouth every 6 (six) hours as needed for headache, mild pain or moderate pain.     Marland Kitchen. topiramate (TOPAMAX) 25 MG tablet Take one tablet at night for one week, then take 2 tablets at night for one week, then take 3 tablets at night. (Patient taking differently: Take 75 mg by mouth daily. ) 90 tablet 3  . SUMAtriptan (IMITREX) 50 MG tablet Take 50 mg by mouth every 2 (two) hours as needed for migraine or headache. May repeat in 2 hours if headache persists or recurs.    . methocarbamol (ROBAXIN) 500 MG tablet Take 1 tablet (500 mg total) by mouth 2 (two) times daily. 20 tablet 0  . naproxen (NAPROSYN) 500 MG tablet Take 1 tablet (500 mg total) by mouth 2 (two) times daily. 30 tablet 0  . oxyCODONE-acetaminophen (PERCOCET/ROXICET) 5-325 MG per tablet Take 2 tablets by mouth every 4 (four) hours as needed for severe pain. 8 tablet 0  . predniSONE (DELTASONE) 5 MG tablet Began taking  6 tablets daily, taper by one tablet daily until off the medication. (Patient not taking: Reported on 07/06/2014) 21 tablet 0   No facility-administered medications prior to visit.    PAST MEDICAL HISTORY: Past Medical History  Diagnosis Date  . Headache   . Migraine without aura, with intractable migraine, so stated, without mention of status migrainosus 04/06/2013    PAST SURGICAL HISTORY: Past Surgical History    Procedure Laterality Date  . Cesarean section  2004    FAMILY HISTORY: Family History  Problem Relation Age of Onset  . Hypertension Mother   . Cataracts Mother   . Hypertension Father   . Gout Father   . Heart disease Father   . Migraines Cousin     SOCIAL HISTORY: Social History   Social History  . Marital Status: Legally Separated    Spouse Name: N/A  . Number of Children: 4  . Years of Education: college 1   Occupational History  . event server    Social History Main Topics  . Smoking status: Current Every Day Smoker  . Smokeless tobacco: Never Used  . Alcohol Use: Yes     Comment: daily  . Drug Use: No  . Sexual Activity: Not on file   Other Topics Concern  . Not on file   Social History Narrative      PHYSICAL EXAM  Filed Vitals:   11/15/14 1452  BP: 109/73  Pulse: 72  Height:  (1.6 m)  Weight: 123 lb (55.792 kg)   Body mass index is 21.79 kg/(m^2).  Generalized: Well developed, in no acute distress   Neurological examination  Mentation: Alert oriented to time, place, history taking. Follows all commands speech and language fluent Cranial nerve II-XII: Pupils were equal round reactive to light. Extraocular movements were full, visual field were full on confrontational test. Facial sensation and strength were normal. Uvula tongue midline. Head turning and shoulder shrug  were normal and symmetric. Motor: The motor testing reveals 5 over 5 strength of all 4 extremities. Good symmetric motor tone is noted throughout.  Sensory: Sensory testing is intact to soft touch on all 4 extremities. No evidence of extinction is noted.  Coordination: Cerebellar testing reveals good finger-nose-finger and heel-to-shin bilaterally.  Gait and station: Gait is normal. Tandem gait is normal. Romberg is negative. No drift is seen.  Reflexes: Deep tendon reflexes are symmetric and normal bilaterally.   DIAGNOSTIC DATA (LABS, IMAGING, TESTING) - I reviewed  patient records, labs, notes, testing and imaging myself where available.     ASSESSMENT AND PLAN 39 y.o. year old female  has a past medical history of Headache and Migraine without aura, with intractable migraine, so stated, without mention of status migrainosus (04/06/2013). here with:  1. Migraine headache  The patient's migraine frequency has increased over the last month. We will increase her Topamax to 100 mg at bedtime. Patient encouraged to take her medication consistently. Patient verbalized understanding. If her headache frequency or severity increases she should let us know. She will follow-up in 4-5 months or sooner if needed.  Butch Penny, MSN, NP-C 11/15/2014, 3:35 PM [facility] [address], [address] [place], [place] [address] [phone]

## 2014-11-15 NOTE — Patient Instructions (Signed)
Increase Topamax to 4 tablets at bedtime (total 100 mg ) If your symptoms worsen or you develop new symptoms please let us know.    Topiramate tablets What is this medicine? TOPIRAMATE (toe PYRE a mate) is used to treat seizures in adults or children with epilepsy. It is also used for the prevention of migraine headaches. This medicine may be used for other purposes; ask your health care provider or pharmacist if you have questions. What should I tell my health care provider before I take this medicine? They need to know if you have any of these conditions: -bleeding disorders -cirrhosis of the liver or liver disease -diarrhea -glaucoma -kidney stones or kidney disease -low blood counts, like low white cell, platelet, or red cell counts -lung disease like asthma, obstructive pulmonary disease, emphysema -metabolic acidosis -on a ketogenic diet -schedule for surgery or a procedure -suicidal thoughts, plans, or attempt; a previous suicide attempt by you or a family member -an unusual or allergic reaction to topiramate, other medicines, foods, dyes, or preservatives -pregnant or trying to get pregnant -breast-feeding How should I use this medicine? Take this medicine by mouth with a glass of water. Follow the directions on the prescription label. Do not crush or chew. You may take this medicine with meals. Take your medicine at regular intervals. Do not take it more often than directed. Talk to your pediatrician regarding the use of this medicine in children. Special care may be needed. While this drug may be prescribed for children as young as 562 years of age for selected conditions, precautions do apply. Overdosage: If you think you have taken too much of this medicine contact a poison control center or emergency room at once. NOTE: This medicine is only for you. Do not share this medicine with others. What if I miss a dose? If you miss a dose, take it as soon as you can. If your next  dose is to be taken in less than 6 hours, then do not take the missed dose. Take the next dose at your regular time. Do not take double or extra doses. What may interact with this medicine? Do not take this medicine with any of the following medications: -probenecid This medicine may also interact with the following medications: -acetazolamide -alcohol -amitriptyline -aspirin and aspirin-like medicines -birth control pills -certain medicines for depression -certain medicines for seizures -certain medicines that treat or prevent blood clots like warfarin, enoxaparin, dalteparin, apixaban, dabigatran, and rivaroxaban -digoxin -hydrochlorothiazide -lithium -medicines for pain, sleep, or muscle relaxation -metformin -methazolamide -NSAIDS, medicines for pain and inflammation, like ibuprofen or naproxen -pioglitazone -risperidone This list may not describe all possible interactions. Give your health care provider a list of all the medicines, herbs, non-prescription drugs, or dietary supplements you use. Also tell them if you smoke, drink alcohol, or use illegal drugs. Some items may interact with your medicine. What should I watch for while using this medicine? Visit your doctor or health care professional for regular checks on your progress. Do not stop taking this medicine suddenly. This increases the risk of seizures if you are using this medicine to control epilepsy. Wear a medical identification bracelet or chain to say you have epilepsy or seizures, and carry a card that lists all your medicines. This medicine can decrease sweating and increase your body temperature. Watch for signs of deceased sweating or fever, especially in children. Avoid extreme heat, hot baths, and saunas. Be careful about exercising, especially in hot weather. Contact your health care  provider right away if you notice a fever or decrease in sweating. You should drink plenty of fluids while taking this medicine. If  you have had kidney stones in the past, this will help to reduce your chances of forming kidney stones. If you have stomach pain, with nausea or vomiting and yellowing of your eyes or skin, call your doctor immediately. You may get drowsy, dizzy, or have blurred vision. Do not drive, use machinery, or do anything that needs mental alertness until you know how this medicine affects you. To reduce dizziness, do not sit or stand up quickly, especially if you are an older patient. Alcohol can increase drowsiness and dizziness. Avoid alcoholic drinks. If you notice blurred vision, eye pain, or other eye problems, seek medical attention at once for an eye exam. The use of this medicine may increase the chance of suicidal thoughts or actions. Pay special attention to how you are responding while on this medicine. Any worsening of mood, or thoughts of suicide or dying should be reported to your health care professional right away. This medicine may increase the chance of developing metabolic acidosis. If left untreated, this can cause kidney stones, bone disease, or slowed growth in children. Symptoms include breathing fast, fatigue, loss of appetite, irregular heartbeat, or loss of consciousness. Call your doctor immediately if you experience any of these side effects. Also, tell your doctor about any surgery you plan on having while taking this medicine since this may increase your risk for metabolic acidosis. Birth control pills may not work properly while you are taking this medicine. Talk to your doctor about using an extra method of birth control. Women who become pregnant while using this medicine may enroll in the [place] Antiepileptic Drug Pregnancy Registry by calling [phone]. This registry collects information about the safety of antiepileptic drug use during pregnancy. What side effects may I notice from receiving this medicine? Side effects that you should report to your doctor or health  care professional as soon as possible: -allergic reactions like skin rash, itching or hives, swelling of the face, lips, or tongue -decreased sweating and/or rise in body temperature -depression -difficulty breathing, fast or irregular breathing patterns -difficulty speaking -difficulty walking or controlling muscle movements -hearing impairment -redness, blistering, peeling or loosening of the skin, including inside the mouth -tingling, pain or numbness in the hands or feet -unusual bleeding or bruising -unusually weak or tired -worsening of mood, thoughts or actions of suicide or dying Side effects that usually do not require medical attention (report to your doctor or health care professional if they continue or are bothersome): -altered taste -back pain, joint or muscle aches and pains -diarrhea, or constipation -headache -loss of appetite -nausea -stomach upset, indigestion -tremors This list may not describe all possible side effects. Call your doctor for medical advice about side effects. You may report side effects to FDA at 1-800-FDA-1088. Where should I keep my medicine? Keep out of the reach of children. Store at room temperature between 15 and 30 degrees C (59 and 86 degrees F) in a tightly closed container. Protect from moisture. Throw away any unused medicine after the expiration date. NOTE: This sheet is a summary. It may not cover all possible information. If you have questions about this medicine, talk to your doctor, pharmacist, or health care provider.    2016, Elsevier/Gold Standard. (2013-01-12 23:17:57)

## 2014-12-29 ENCOUNTER — Encounter: Payer: Self-pay | Admitting: Adult Health

## 2014-12-29 ENCOUNTER — Ambulatory Visit (INDEPENDENT_AMBULATORY_CARE_PROVIDER_SITE_OTHER): Payer: Medicaid Other | Admitting: Adult Health

## 2014-12-29 VITALS — BP 117/86 | HR 83 | Ht 63.0 in | Wt 122.5 lb

## 2014-12-29 DIAGNOSIS — G500 Trigeminal neuralgia: Secondary | ICD-10-CM

## 2014-12-29 DIAGNOSIS — G43009 Migraine without aura, not intractable, without status migrainosus: Secondary | ICD-10-CM | POA: Diagnosis not present

## 2014-12-29 MED ORDER — CARBAMAZEPINE 200 MG PO TABS: 200.0000 mg | ORAL_TABLET | Freq: Every day | ORAL | Status: DC

## 2014-12-29 NOTE — Patient Instructions (Signed)
Continue Topamax Begin Carbamazepine 200 mg at bedtime If your symptoms worsen or you develop new symptoms please let us know.   Carbamazepine tablets What is this medicine? CARBAMAZEPINE (kar ba MAZ e peen) is used to control seizures caused by certain types of epilepsy. This medicine is also used to treat nerve related pain. It is not for common aches and pains. This medicine may be used for other purposes; ask your health care provider or pharmacist if you have questions. What should I tell my health care provider before I take this medicine? They need to know if you have any of these conditions: -Asian ancestry -bone marrow disease -glaucoma -heart disease or irregular heartbeat -kidney disease -liver disease -low blood counts, like low white cell, platelet, or red cell counts -porphyria -psychotic disorders -suicidal thoughts, plans, or attempt; a previous suicide attempt by you or a family member -an unusual or allergic reaction to carbamazepine, tricyclic antidepressants, phenytoin, phenobarbital or other medicines, foods, dyes, or preservatives -pregnant or trying to get pregnant -breast-feeding How should I use this medicine? Take this medicine by mouth with a glass of water. Follow the directions on the prescription label. Take this medicine with food. Take your doses at regular intervals. Do not take your medicine more often than directed. Do not stop taking this medicine except on the advice of your doctor or health care professional. A special MedGuide will be given to you by the pharmacist with each prescription and refill. Be sure to read this information carefully each time. Talk to your pediatrician regarding the use of this medicine in children. Special care may be needed. Overdosage: If you think you have taken too much of this medicine contact a poison control center or emergency room at once. NOTE: This medicine is only for you. Do not share this medicine with  others. What if I miss a dose? If you miss a dose, take it as soon as you can. If it is almost time for your next dose, take only that dose. Do not take double or extra doses. What may interact with this medicine? Do not take this medicine with any of the following medications: -certain medicines used to treat HIV infection or AIDS that are given in combination with cobicistat - delavirdine - MAOIs like Carbex, Eldepryl, Marplan, Nardil, and Parnate - nefazodone - oxcarbazepine This medicine may also interact with the following medications: - acetaminophen - acetazolamide - barbiturate medicines for inducing sleep or treating seizures, like phenobarbital - certain antibiotics like clarithromycin, erythromycin or troleandomycin - cimetidine - cyclosporine - danazol - dicumarol - doxycycline - female hormones, including estrogens and birth control pills - grapefruit juice - isoniazid, INH - levothyroxine and other thyroid hormones - lithium and other medicines to treat mood problems or psychotic disturbances - loratadine - medicines for angina or high blood pressure - medicines for cancer - medicines for depression or anxiety - medicines for sleep - medicines to treat fungal infections, like fluconazole, itraconazole or ketoconazole - medicines used to treat HIV infection or AIDS - methadone - niacinamide - praziquantel - propoxyphene - rifampin or rifabutin - seizure or epilepsy medicine - steroid medicines such as prednisone or cortisone - theophylline - tramadol - warfarin This list may not describe all possible interactions. Give your health care provider a list of all the medicines, herbs, non-prescription drugs, or dietary supplements you use. Also tell them if you smoke, drink alcohol, or use illegal drugs. Some items may interact with your medicine. What should  I watch for while using this medicine? Visit your doctor or health care  professional for a regular check on your progress. Do not change brands or dosage forms of this medicine without discussing the change with your doctor or health care professional. If you are taking this medicine for epilepsy (seizures) do not stop taking it suddenly. This increases the risk of seizures. Wear a Arboriculturist or necklace. Carry an identification card with information about your condition, medications, and doctor or health care professional. Bonita Quin may get drowsy, dizzy, or have blurred vision. Do not drive, use machinery, or do anything that needs mental alertness until you know how this medicine affects you. To reduce dizzy or fainting spells, do not sit or stand up quickly, especially if you are an older patient. Alcohol can increase drowsiness and dizziness. Avoid alcoholic drinks. Birth control pills may not work properly while you are taking this medicine. Talk to your doctor about using an extra method of birth control. This medicine can make you more sensitive to the sun. Keep out of the sun. If you cannot avoid being in the sun, wear protective clothing and use sunscreen. Do not use sun lamps or tanning beds/booths. The use of this medicine may increase the chance of suicidal thoughts or actions. Pay special attention to how you are responding while on this medicine. Any worsening of mood, or thoughts of suicide or dying should be reported to your health care professional right away. Women who become pregnant while using this medicine may enroll in the [place] Antiepileptic Drug Pregnancy Registry by calling [phone]. This registry collects information about the safety of antiepileptic drug use during pregnancy. What side effects may I notice from receiving this medicine? Side effects that you should report to your doctor or health care professional as soon as possible: -allergic reactions like skin rash, itching or hives, swelling of the face, lips, or  tongue -breathing problems -changes in vision -confusion -dark urine -fast or irregular heartbeat -fever or chills, sore throat -mouth ulcers -pain or difficulty passing urine -redness, blistering, peeling or loosening of the skin, including inside the mouth -ringing in the ears -seizures -stomach pain -swollen joints or muscle/joint aches and pains -unusual bleeding or bruising -unusually weak or tired -vomiting -worsening of mood, thoughts or actions of suicide or dying -yellowing of the eyes or skin Side effects that usually do not require medical attention (report to your doctor or health care professional if they continue or are bothersome): -clumsiness or unsteadiness -diarrhea or constipation -headache -increased sweating -nausea This list may not describe all possible side effects. Call your doctor for medical advice about side effects. You may report side effects to FDA at 1-800-FDA-1088. Where should I keep my medicine? Keep out of reach of children. Store at room temperature below 30 degrees C (86 degrees F). Keep container tightly closed. Protect from moisture. Throw away any unused medicine after the expiration date. NOTE: This sheet is a summary. It may not cover all possible information. If you have questions about this medicine, talk to your doctor, pharmacist, or health care provider.    2016, Elsevier/Gold Standard. (2013-12-31 15:38:34)

## 2014-12-29 NOTE — Progress Notes (Signed)
I have read the note, and I agree with the clinical assessment and plan.  Barbara Caldwell   

## 2014-12-29 NOTE — Progress Notes (Signed)
PATIENT: Barbara Caldwell Salon DOB: [birth year]  REASON FOR VISIT: follow up- migraine headaches HISTORY FROM: patient  HISTORY OF PRESENT ILLNESS: Ms Barbara Caldwell is a 39 year old female with a history of migraine headaches. She returns today for follow-up. The patient states that her typical migraines resolved after she increased her Topamax. However now she is been having sharp shooting pains in the right side of the face that usually occur during the night. She states that the pain originates from the right ear and travels to the right jaw and sometimes the cheek. She describes the pain as sharp and stabbing. She states that he can last for several hours before it resolves. The patient states that she even tried taking Klonopin before bedtime but that offered no benefit. She follows up with her dentist regularly. She denies any new medical history. She returns today on evaluation.  HISTORY 11/15/14: Ms Barbara Caldwell is a 39 year old female with a history of migraine headaches. She returns today for follow-up. The patient has not been seen in our office in 1.5 years- she no showed for her last 2 appointments. Patient reports that in the last month her headache frequency increased. She is currently taken Topamax 75 mg at bedtime. However she reports that over the summer she did not take this medication consistently as she knew her prescription was running out. She states that she has approximately 2-3 headaches a week. This weekend she states that she had a total of 4 headaches. She states that her headaches are normally located in the left frontal region. Occasionally is associated with some scalp sensitivity as well as jaw soreness. She states that she will occasionally have nausea and vomiting. Intermittent photophobia and phonophobia. She states when her headaches occur she has to lay down in a dark room. She was prescribed Imitrex by her primary care has offered her some relief. She returns today for an  evaluation.  HISTORY 04/06/13 (WILLIS): Ms. Barbara Caldwell is a 39 year old right-handed black female with a history of migraine headache dating back 8 years. The patient indicates that her usual headaches are usually on the right side of the head. Within the last 2 weeks, the headaches have become very frequent, and occurring 2 or 3 times daily, lasting up to 4 hours. The patient has had some nausea with the headache, and this morning, the patient had onset of some right hand and right foot numbness and tingling. The patient notes photophobia with the headache, with a pounding sensation in the head on the right side. The patient denies photophobia. The patient has scalp tenderness with the headache. The patient may feel weak all over with the headache. The patient does not note any particular activating factors for the headache. The patient has been placed on Imitrex, but she has required this medication frequently. The patient comes to this office for further evaluation.   REVIEW OF SYSTEMS: Out of a complete 14 system review of symptoms, the patient complains only of the following symptoms, and all other reviewed systems are negative.  Eye redness, eye pain, blurred vision, headache, numbness, neck pain, neck stiffness, confusion  ALLERGIES: No Known Allergies  HOME MEDICATIONS: Outpatient Prescriptions Prior to Visit  Medication Sig Dispense Refill  . ibuprofen (ADVIL,MOTRIN) 200 MG tablet Take 600 mg by mouth every 6 (six) hours as needed for headache, mild pain or moderate pain.     . SUMAtriptan (IMITREX) 50 MG tablet Take 50 mg by mouth every 2 (two) hours as needed for migraine or  headache. May repeat in 2 hours if headache persists or recurs.    . topiramate (TOPAMAX) 25 MG tablet Take 4 tablets (100 mg total) by mouth at bedtime. 120 tablet 3   No facility-administered medications prior to visit.    PAST MEDICAL HISTORY: Past Medical History  Diagnosis Date  . Headache   . Migraine without  aura, with intractable migraine, so stated, without mention of status migrainosus 04/06/2013    PAST SURGICAL HISTORY: Past Surgical History  Procedure Laterality Date  . Cesarean section  2004    FAMILY HISTORY: Family History  Problem Relation Age of Onset  . Hypertension Mother   . Cataracts Mother   . Hypertension Father   . Gout Father   . Heart disease Father   . Migraines Cousin     SOCIAL HISTORY: Social History   Social History  . Marital Status: Legally Separated    Spouse Name: N/A  . Number of Children: 4  . Years of Education: college 1   Occupational History  . event server    Social History Main Topics  . Smoking status: Current Every Day Smoker  . Smokeless tobacco: Never Used  . Alcohol Use: Yes     Comment: daily  . Drug Use: No  . Sexual Activity: Not on file   Other Topics Concern  . Not on file   Social History Narrative      PHYSICAL EXAM  Filed Vitals:   12/29/14 1254  BP: 117/86  Pulse: 83  Height: 5\' 3"  (1.6 m)  Weight: 122 lb 8 oz (55.566 kg)   Body mass index is 21.71 kg/(m^2).  Generalized: Well developed, in no acute distress   Neurological examination  Mentation: Alert oriented to time, place, history taking. Follows all commands speech and language fluent Cranial nerve II-XII: Pupils were equal round reactive to light. Extraocular movements were full, visual field were full on confrontational test. Facial sensation and strength were normal. Uvula tongue midline. Head turning and shoulder shrug  were normal and symmetric. Motor: The motor testing reveals 5 over 5 strength of all 4 extremities. Good symmetric motor tone is noted throughout.  Sensory: Sensory testing is intact to soft touch on all 4 extremities. No evidence of extinction is noted.  Coordination: Cerebellar testing reveals good finger-nose-finger and heel-to-shin bilaterally.  Gait and station: Gait is normal. Tandem gait is normal. Romberg is negative. No  drift is seen.  Reflexes: Deep tendon reflexes are symmetric and normal bilaterally.   DIAGNOSTIC DATA (LABS, IMAGING, TESTING) - I reviewed patient records, labs, notes, testing and imaging myself where available.   ASSESSMENT AND PLAN 39 y.o. year old female  has a past medical history of Headache and Migraine without aura, with intractable migraine, so stated, without mention of status migrainosus (04/06/2013). here with:  1. Migraine headache 2. Trigeminal neuralgia right  The patient's typical migraines have improved with Topamax. However now she is having sharp shooting pains in the right side of face. She states that Topamax has not been beneficial for this. For that reason I will start the patient on a low dose of carbamazepine. She will take 200 mg at bedtime. She will let me know if this is beneficial. Patient advised that if her symptoms worsen or she develops new symptoms she should let us know. She will keep her appointment in February.   Butch PennyMegan Abrahm Mancia, MSN, NP-C 12/29/2014, 1:09 PM [facility] [address], [address] [place], [place] [address] [phone]

## 2015-03-21 ENCOUNTER — Ambulatory Visit (INDEPENDENT_AMBULATORY_CARE_PROVIDER_SITE_OTHER): Payer: Medicaid Other | Admitting: Adult Health

## 2015-03-21 ENCOUNTER — Encounter: Payer: Self-pay | Admitting: Adult Health

## 2015-03-21 VITALS — BP 131/84 | HR 93 | Ht 63.0 in | Wt 120.0 lb

## 2015-03-21 DIAGNOSIS — G500 Trigeminal neuralgia: Secondary | ICD-10-CM

## 2015-03-21 DIAGNOSIS — G43009 Migraine without aura, not intractable, without status migrainosus: Secondary | ICD-10-CM

## 2015-03-21 MED ORDER — TOPIRAMATE 25 MG PO TABS: 100.0000 mg | ORAL_TABLET | Freq: Every day | ORAL | Status: DC

## 2015-03-21 MED ORDER — CARBAMAZEPINE 200 MG PO TABS: 200.0000 mg | ORAL_TABLET | Freq: Every day | ORAL | Status: DC

## 2015-03-21 NOTE — Patient Instructions (Signed)
Continue using Topamax and carbamazepine If your symptoms worsen or you develop new symptoms please let us know.

## 2015-03-21 NOTE — Progress Notes (Signed)
PATIENT: Barbara Caldwell Salon DOB: 10/17/1975  REASON FOR VISIT: follow up HISTORY FROM: patient  HISTORY OF PRESENT ILLNESS: Barbara Caldwell is a 40 year old female with a history of migraine headaches. She returns today for follow-up. She continues on Topamax 100 mg at bedtime. She reports that her headaches have been stable. She states that occasionally she can go a whole month without having a migraine. She states that she continues to try to figure out her triggers for her headaches. Patient states that since she started carbamazepine she no longer has the pain in the jaw. She is tolerating this medication well. She denies any new neurological symptoms. She returns today for an evaluation.  HISTORY 11/15/14: Barbara Caldwell is a 40 year old female with a history of migraine headaches. She returns today for follow-up. The patient has not been seen in our office in 1.5 years- she no showed for her last 2 appointments. Patient reports that in the last month her headache frequency increased. She is currently taken Topamax 75 mg at bedtime. However she reports that over the summer she did not take this medication consistently as she knew her prescription was running out. She states that she has approximately 2-3 headaches a week. This weekend she states that she had a total of 4 headaches. She states that her headaches are normally located in the left frontal region. Occasionally is associated with some scalp sensitivity as well as jaw soreness. She states that she will occasionally have nausea and vomiting. Intermittent photophobia and phonophobia. She states when her headaches occur she has to lay down in a dark room. She was prescribed Imitrex by her primary care has offered her some relief. She returns today for an evaluation.  HISTORY 04/06/13 (WILLIS): Barbara. Caldwell is a 40 year old right-handed black female with a history of migraine headache dating back 8 years. The patient indicates that her usual headaches are  usually on the right side of the head. Within the last 2 weeks, the headaches have become very frequent, and occurring 2 or 3 times daily, lasting up to 4 hours. The patient has had some nausea with the headache, and this morning, the patient had onset of some right hand and right foot numbness and tingling. The patient notes photophobia with the headache, with a pounding sensation in the head on the right side. The patient denies photophobia. The patient has scalp tenderness with the headache. The patient may feel weak all over with the headache. The patient does not note any particular activating factors for the headache. The patient has been placed on Imitrex, but she has required this medication frequently. The patient comes to this office for further evaluation.  REVIEW OF SYSTEMS: Out of a complete 14 system review of symptoms, the patient complains only of the following symptoms, and all other reviewed systems are negative.  Appetite change, ringing in ears, neck stiffness   ALLERGIES: No Known Allergies  HOME MEDICATIONS: Outpatient Prescriptions Prior to Visit  Medication Sig Dispense Refill  . ibuprofen (ADVIL,MOTRIN) 200 MG tablet Take 600 mg by mouth every 6 (six) hours as needed for headache, mild pain or moderate pain.     . SUMAtriptan (IMITREX) 50 MG tablet Take 50 mg by mouth every 2 (two) hours as needed for migraine or headache. May repeat in 2 hours if headache persists or recurs.    . carbamazepine (TEGRETOL) 200 MG tablet Take 1 tablet (200 mg total) by mouth at bedtime. 30 tablet 3  . topiramate (TOPAMAX) 25  MG tablet Take 4 tablets (100 mg total) by mouth at bedtime. 120 tablet 3   No facility-administered medications prior to visit.    PAST MEDICAL HISTORY: Past Medical History  Diagnosis Date  . Headache   . Migraine without aura, with intractable migraine, so stated, without mention of status migrainosus 04/06/2013    PAST SURGICAL HISTORY: Past Surgical History    Procedure Laterality Date  . Cesarean section  2004    FAMILY HISTORY: Family History  Problem Relation Age of Onset  . Hypertension Mother   . Cataracts Mother   . Hypertension Father   . Gout Father   . Heart disease Father   . Migraines Cousin     SOCIAL HISTORY: Social History   Social History  . Marital Status: Legally Separated    Spouse Name: N/A  . Number of Children: 4  . Years of Education: college 1   Occupational History  . event server    Social History Main Topics  . Smoking status: Current Every Day Smoker -- 0.25 packs/day    Types: Cigarettes  . Smokeless tobacco: Never Used  . Alcohol Use: 0.0 oz/week    0 Standard drinks or equivalent per week     Comment: daily  . Drug Use: No  . Sexual Activity: Not on file   Other Topics Concern  . Not on file   Social History Narrative      PHYSICAL EXAM  Filed Vitals:   03/21/15 1518  BP: 131/84  Pulse: 93  Height:  (1.6 m)  Weight: 120 lb (54.432 kg)   Body mass index is 21.26 kg/(m^2).  Generalized: Well developed, in no acute distress   Neurological examination  Mentation: Alert oriented to time, place, history taking. Follows all commands speech and language fluent Cranial nerve II-XII: Pupils were equal round reactive to light. Extraocular movements were full, visual field were full on confrontational test. Facial sensation and strength were normal. Uvula tongue midline. Head turning and shoulder shrug  were normal and symmetric. Motor: The motor testing reveals 5 over 5 strength of all 4 extremities. Good symmetric motor tone is noted throughout.  Sensory: Sensory testing is intact to soft touch on all 4 extremities. No evidence of extinction is noted.  Coordination: Cerebellar testing reveals good finger-nose-finger and heel-to-shin bilaterally.  Gait and station: Gait is normal. Tandem gait is normal. Romberg is negative. No drift is seen.  Reflexes: Deep tendon reflexes are  symmetric and normal bilaterally.   DIAGNOSTIC DATA (LABS, IMAGING, TESTING) - I reviewed patient records, labs, notes, testing and imaging myself where available.     ASSESSMENT AND PLAN 40 y.o. year old female  has a past medical history of Headache and Migraine without aura, with intractable migraine, so stated, without mention of status migrainosus (04/06/2013). here with:  1. Migraine headaches 2. Trigeminal neuralgia  Overall the patient is doing well. She will continue on Topamax 100 mg at bedtime. She will also continue on carbamazepine 200 mg daily. Patient advised that if her symptoms return or she develops new symptoms she should let us know. She will follow-up in 6 months with Dr. Anne Hahn or sooner if needed.     Butch Penny, MSN, NP-C 03/21/2015, 3:31 PM [facility] [address] [place], [place] [phone]

## 2015-03-21 NOTE — Progress Notes (Signed)
I have read the note, and I agree with the clinical assessment and plan.  Barbara Caldwell   

## 2015-09-08 ENCOUNTER — Encounter: Payer: Self-pay | Admitting: Neurology

## 2015-09-08 ENCOUNTER — Ambulatory Visit (INDEPENDENT_AMBULATORY_CARE_PROVIDER_SITE_OTHER): Payer: Medicaid Other | Admitting: Neurology

## 2015-09-08 VITALS — BP 121/78 | HR 99 | Ht 62.0 in | Wt 129.0 lb

## 2015-09-08 DIAGNOSIS — G43009 Migraine without aura, not intractable, without status migrainosus: Secondary | ICD-10-CM | POA: Diagnosis not present

## 2015-09-08 DIAGNOSIS — Z5181 Encounter for therapeutic drug level monitoring: Secondary | ICD-10-CM

## 2015-09-08 MED ORDER — CARBAMAZEPINE 200 MG PO TABS: 200.0000 mg | ORAL_TABLET | Freq: Every day | ORAL | 3 refills | Status: DC

## 2015-09-08 MED ORDER — TOPIRAMATE 100 MG PO TABS: 100.0000 mg | ORAL_TABLET | Freq: Every day | ORAL | 3 refills | Status: DC

## 2015-09-08 NOTE — Progress Notes (Signed)
Reason for visit: Migraine headache  Barbara Caldwell is an 40 y.o. female  History of present illness:  Barbara Caldwell is a 40 year old right-handed black female with a history of migraine headaches. The patient has done well with Topamax, currently on 100 mg daily. The patient has had Imitrex to take in the past, but she has not needed this medication in quite some time. She generally will take ibuprofen with the headache comes on. The patient is in school this time, she has been under some stress with this, and she has had 3 minor headaches within the last month. The patient takes carbamazepine for onset of sharp shooting pains on the right side of the face that would generally occur at night. The patient is doing better with these pains on low-dose carbamazepine. The patient overall is functioning very well. She returns for an evaluation.  Past Medical History:  Diagnosis Date  . Headache   . Migraine without aura, with intractable migraine, so stated, without mention of status migrainosus 04/06/2013    Past Surgical History:  Procedure Laterality Date  . CESAREAN SECTION  2004    Family History  Problem Relation Age of Onset  . Hypertension Mother   . Cataracts Mother   . Hypertension Father   . Gout Father   . Heart disease Father   . Migraines Cousin     Social history:  reports that she has been smoking Cigarettes.  She has been smoking about 0.20 packs per day. She has never used smokeless tobacco. She reports that she drinks alcohol. She reports that she does not use drugs.   No Known Allergies  Medications:  Prior to Admission medications   Medication Sig Start Date End Date Taking? Authorizing Provider  carbamazepine (TEGRETOL) 200 MG tablet Take 1 tablet (200 mg total) by mouth at bedtime. 03/21/15   Butch PennyMegan Millikan, NP  ibuprofen (ADVIL,MOTRIN) 200 MG tablet Take 600 mg by mouth every 6 (six) hours as needed for headache, mild pain or moderate pain.     Historical  Provider, MD  SUMAtriptan (IMITREX) 50 MG tablet Take 50 mg by mouth every 2 (two) hours as needed for migraine or headache. May repeat in 2 hours if headache persists or recurs.    Historical Provider, MD  topiramate (TOPAMAX) 25 MG tablet Take 4 tablets (100 mg total) by mouth at bedtime. 03/21/15   Butch PennyMegan Millikan, NP    ROS:  Out of a complete 14 system review of symptoms, the patient complains only of the following symptoms, and all other reviewed systems are negative.  Blurred vision Frequent waking, snoring Headache  Blood pressure 121/78, pulse 99, height 5\' 2"  (1.575 m), weight 129 lb (58.5 kg).  Physical Exam  General: The patient is alert and cooperative at the time of the examination.  Skin: No significant peripheral edema is noted.   Neurologic Exam  Mental status: The patient is alert and oriented x 3 at the time of the examination. The patient has apparent normal recent and remote memory, with an apparently normal attention span and concentration ability.   Cranial nerves: Facial symmetry is present. Speech is normal, no aphasia or dysarthria is noted. Extraocular movements are full. Visual fields are full.  Motor: The patient has good strength in all 4 extremities.  Sensory examination: Soft touch sensation is symmetric on the face, arms, and legs.  Coordination: The patient has good finger-nose-finger and heel-to-shin bilaterally.  Gait and station: The patient has a normal  gait. Tandem gait is normal. Romberg is negative. No drift is seen.  Reflexes: Deep tendon reflexes are symmetric.   Assessment/Plan:  1. Migraine headache  2. Right facial pain, questionable trigeminal neuralgia  The patient has done well with combination of carbamazepine and Topamax, prescriptions were written for these medications, the patient will follow-up in one year. She will have blood work done today.  Marlan Palau. Keith Willis MD 09/08/2015 8:15 AM  [facility] [address] [place], [place] [id number]  Phone [phone] Fax [phone]

## 2015-09-09 ENCOUNTER — Telehealth: Payer: Self-pay | Admitting: Neurology

## 2015-09-09 LAB — COMPREHENSIVE METABOLIC PANEL
ALK PHOS: 67 IU/L (ref 39–117)
ALT: 6 IU/L (ref 0–32)
AST: 11 IU/L (ref 0–40)
Albumin/Globulin Ratio: 1.7 (ref 1.2–2.2)
Albumin: 4 g/dL (ref 3.5–5.5)
BUN / CREAT RATIO: 7 — AB (ref 9–23)
BUN: 7 mg/dL (ref 6–24)
Bilirubin Total: 0.3 mg/dL (ref 0.0–1.2)
CALCIUM: 9.5 mg/dL (ref 8.7–10.2)
CO2: 22 mmol/L (ref 18–29)
CREATININE: 0.95 mg/dL (ref 0.57–1.00)
Chloride: 103 mmol/L (ref 96–106)
GFR, EST AFRICAN AMERICAN: 86 mL/min/{1.73_m2} (ref >59)
GFR, EST NON AFRICAN AMERICAN: 75 mL/min/{1.73_m2} (ref >59)
GLOBULIN, TOTAL: 2.4 g/dL (ref 1.5–4.5)
Glucose: 78 mg/dL (ref 65–99)
Potassium: 4.6 mmol/L (ref 3.5–5.2)
SODIUM: 140 mmol/L (ref 134–144)
Total Protein: 6.4 g/dL (ref 6.0–8.5)

## 2015-09-09 LAB — CBC WITH DIFFERENTIAL/PLATELET
BASOS: 0 %
Basophils Absolute: 0 10*3/uL (ref 0.0–0.2)
EOS (ABSOLUTE): 0.2 10*3/uL (ref 0.0–0.4)
EOS: 2 %
HEMATOCRIT: 45.7 % (ref 34.0–46.6)
HEMOGLOBIN: 15 g/dL (ref 11.1–15.9)
Immature Grans (Abs): 0.1 10*3/uL (ref 0.0–0.1)
Immature Granulocytes: 1 %
LYMPHS ABS: 4.1 10*3/uL — AB (ref 0.7–3.1)
Lymphs: 39 %
MCH: 34.2 pg — AB (ref 26.6–33.0)
MCHC: 32.8 g/dL (ref 31.5–35.7)
MCV: 104 fL — ABNORMAL HIGH (ref 79–97)
MONOCYTES: 6 %
Monocytes Absolute: 0.6 10*3/uL (ref 0.1–0.9)
Neutrophils Absolute: 5.6 10*3/uL (ref 1.4–7.0)
Neutrophils: 52 %
Platelets: 388 10*3/uL — ABNORMAL HIGH (ref 150–379)
RBC: 4.39 x10E6/uL (ref 3.77–5.28)
RDW: 13.7 % (ref 12.3–15.4)
WBC: 10.5 10*3/uL (ref 3.4–10.8)

## 2015-09-09 LAB — CARBAMAZEPINE LEVEL, TOTAL: Carbamazepine (Tegretol), S: 0 ug/mL — ABNORMAL LOW (ref 4.0–12.0)

## 2015-09-09 NOTE — Telephone Encounter (Signed)
I called patient. Blood work is unremarkable with exception that the carbamazepine level is 0. The patient indicates that she does take the medication, but not on a daily basis. The patient does not take the medication for seizures, she takes for pain control.

## 2015-09-13 ENCOUNTER — Telehealth: Payer: Self-pay | Admitting: *Deleted

## 2015-09-13 NOTE — Telephone Encounter (Signed)
Re: carbamazepine PA- Spoke with Durward Mallardamille, [facility] and then spoke with [employer] Homes[employer]uella. Consuella stated that Medicaid prefers brand. If pharmacy is called and instructed to resubmit carbamazepine claim as "Medicaid prefers brand name, DAW 1" the medication will be covered. Incident # [id number]  Called [facility] [phone], spoke with pharmacist, Caryn BeeKevin and gave him above [facility] information/instructions. He resubmitted claim and stated it went through. He will have to order it for the patient. [facility] will notify patient.

## 2015-09-13 NOTE — Telephone Encounter (Signed)
Put on hold, phone was cut off after 5-6 minutes. Will call later re: PA for caramazepine

## 2015-11-03 ENCOUNTER — Emergency Department (HOSPITAL_COMMUNITY): Payer: Medicaid Other

## 2015-11-03 ENCOUNTER — Encounter (HOSPITAL_COMMUNITY): Payer: Self-pay | Admitting: *Deleted

## 2015-11-03 ENCOUNTER — Emergency Department (HOSPITAL_COMMUNITY)
Admission: EM | Admit: 2015-11-03 | Discharge: 2015-11-03 | Disposition: A | Discharge status: Home / Self Care | Payer: Medicaid Other | Attending: Emergency Medicine | Admitting: Emergency Medicine

## 2015-11-03 DIAGNOSIS — N2889 Other specified disorders of kidney and ureter: Secondary | ICD-10-CM

## 2015-11-03 DIAGNOSIS — F17210 Nicotine dependence, cigarettes, uncomplicated: Secondary | ICD-10-CM | POA: Diagnosis not present

## 2015-11-03 DIAGNOSIS — A599 Trichomoniasis, unspecified: Secondary | ICD-10-CM | POA: Insufficient documentation

## 2015-11-03 DIAGNOSIS — N200 Calculus of kidney: Secondary | ICD-10-CM | POA: Diagnosis not present

## 2015-11-03 DIAGNOSIS — Z79899 Other long term (current) drug therapy: Secondary | ICD-10-CM | POA: Insufficient documentation

## 2015-11-03 DIAGNOSIS — R1031 Right lower quadrant pain: Secondary | ICD-10-CM | POA: Diagnosis present

## 2015-11-03 LAB — COMPREHENSIVE METABOLIC PANEL
ALT: 8 U/L — AB (ref 14–54)
AST: 20 U/L (ref 15–41)
Albumin: 4.7 g/dL (ref 3.5–5.0)
Alkaline Phosphatase: 67 U/L (ref 38–126)
Anion gap: 9 (ref 5–15)
BILIRUBIN TOTAL: 0.9 mg/dL (ref 0.3–1.2)
BUN: 9 mg/dL (ref 6–20)
CHLORIDE: 107 mmol/L (ref 101–111)
CO2: 22 mmol/L (ref 22–32)
CREATININE: 1.08 mg/dL — AB (ref 0.44–1.00)
Calcium: 9.5 mg/dL (ref 8.9–10.3)
GFR calc Af Amer: >60 mL/min (ref >60)
Glucose, Bld: 111 mg/dL — ABNORMAL HIGH (ref 65–99)
Potassium: 3.8 mmol/L (ref 3.5–5.1)
Sodium: 138 mmol/L (ref 135–145)
TOTAL PROTEIN: 7.9 g/dL (ref 6.5–8.1)

## 2015-11-03 LAB — CBC
HCT: 49.7 % — ABNORMAL HIGH (ref 36.0–46.0)
Hemoglobin: 16.9 g/dL — ABNORMAL HIGH (ref 12.0–15.0)
MCH: 34.3 pg — ABNORMAL HIGH (ref 26.0–34.0)
MCHC: 34 g/dL (ref 30.0–36.0)
MCV: 101 fL — AB (ref 78.0–100.0)
PLATELETS: 415 10*3/uL — AB (ref 150–400)
RBC: 4.92 MIL/uL (ref 3.87–5.11)
RDW: 12.9 % (ref 11.5–15.5)
WBC: 21.3 10*3/uL — AB (ref 4.0–10.5)

## 2015-11-03 LAB — I-STAT BETA HCG BLOOD, ED (MC, WL, AP ONLY): I-stat hCG, quantitative: <5 m[IU]/mL (ref <5)

## 2015-11-03 LAB — URINALYSIS, ROUTINE W REFLEX MICROSCOPIC
BILIRUBIN URINE: NEGATIVE
GLUCOSE, UA: NEGATIVE mg/dL
KETONES UR: 15 mg/dL — AB
Nitrite: NEGATIVE
PROTEIN: NEGATIVE mg/dL
Specific Gravity, Urine: 1.024 (ref 1.005–1.030)
pH: 6.5 (ref 5.0–8.0)

## 2015-11-03 LAB — URINE MICROSCOPIC-ADD ON

## 2015-11-03 LAB — LIPASE, BLOOD: Lipase: 15 U/L (ref 11–51)

## 2015-11-03 LAB — WET PREP, GENITAL
Sperm: NONE SEEN
YEAST WET PREP: NONE SEEN

## 2015-11-03 MED ORDER — ONDANSETRON HCL 4 MG/2ML IJ SOLN
4.0000 mg | Freq: Once | INTRAMUSCULAR | Status: AC | PRN
  Administered 2015-11-03: 4 mg via INTRAVENOUS
  Filled 2015-11-03: qty 2

## 2015-11-03 MED ORDER — HYDROMORPHONE HCL 1 MG/ML IJ SOLN
1.0000 mg | Freq: Once | INTRAMUSCULAR | Status: AC
  Administered 2015-11-03: 1 mg via INTRAVENOUS
  Filled 2015-11-03: qty 1

## 2015-11-03 MED ORDER — ONDANSETRON 4 MG PO TBDP: 4.0000 mg | ORAL_TABLET | Freq: Three times a day (TID) | ORAL | 0 refills | Status: AC | PRN

## 2015-11-03 MED ORDER — TAMSULOSIN HCL 0.4 MG PO CAPS: 0.4000 mg | ORAL_CAPSULE | Freq: Every day | ORAL | 0 refills | Status: DC

## 2015-11-03 MED ORDER — INSULIN GLARGINE 100 UNIT/ML ~~LOC~~ SOLN: 15.0000 [IU] | Freq: Once | SUBCUTANEOUS | Status: DC

## 2015-11-03 MED ORDER — NAPROXEN 500 MG PO TABS: 500.0000 mg | ORAL_TABLET | Freq: Two times a day (BID) | ORAL | 0 refills | Status: DC

## 2015-11-03 MED ORDER — SODIUM CHLORIDE 0.9 % IV BOLUS (SEPSIS)
1000.0000 mL | Freq: Once | INTRAVENOUS | Status: AC
  Administered 2015-11-03: 1000 mL via INTRAVENOUS

## 2015-11-03 MED ORDER — KETOROLAC TROMETHAMINE 30 MG/ML IJ SOLN
30.0000 mg | Freq: Once | INTRAMUSCULAR | Status: AC
  Administered 2015-11-03: 30 mg via INTRAVENOUS
  Filled 2015-11-03: qty 1

## 2015-11-03 MED ORDER — METRONIDAZOLE 500 MG PO TABS: 500.0000 mg | ORAL_TABLET | Freq: Two times a day (BID) | ORAL | 0 refills | Status: DC

## 2015-11-03 MED ORDER — IOPAMIDOL (ISOVUE-300) INJECTION 61%
30.0000 mL | Freq: Once | INTRAVENOUS | Status: AC | PRN
  Administered 2015-11-03: 30 mL via ORAL

## 2015-11-03 MED ORDER — OXYCODONE-ACETAMINOPHEN 5-325 MG PO TABS: 1.0000 | ORAL_TABLET | Freq: Four times a day (QID) | ORAL | 0 refills | Status: AC | PRN

## 2015-11-03 MED ORDER — IOPAMIDOL (ISOVUE-300) INJECTION 61%
100.0000 mL | Freq: Once | INTRAVENOUS | Status: AC | PRN
  Administered 2015-11-03: 100 mL via INTRAVENOUS

## 2015-11-03 NOTE — Discharge Instructions (Signed)
You have been diagnosed with kidney stones.  Use your pain medication as prescribed and do not operate heavy machinery while on this medication. Note that your pain medication contains Acetaminophen (Tylenol), therefore it is not recommended to take additional Tylenol while on your pain medication. Continue to drink fluids to help you pass the stones. Taking flomax as directed will also help to pass the stone. Use Zofran for nausea as directed. Increase hydration. Follow up with your primary care provider  Return to the ED immediately if you develop fever that persists > 101, uncontrolled pain or vomiting, or other concerns.  Read the instructions below to learn more about kidney stones.   Please take all of your antibiotics until finished! No sexual intercourse until you have successfully completed antibiotics and symptoms have resolved.   As we discussed, we incidentally found a mass on your right kidney. You will need to follow up with your primary care provider about this and possibly obtain further imaging such as an MRI.   Kidney Stones Kidney stones (ureteral lithiasis) are solid masses that form inside your kidneys. The intense pain is caused by the stone moving through the kidney, ureter, bladder, and urethra (urinary tract). When the stone moves, the ureter starts to spasm around the stone. The stone is usually passed in the urine.   HOME CARE Drink enough fluids to keep your urine clear or pale yellow. This helps to get the stone out.  Only take medicine as told by your doctor.  Follow up with your doctor as told.   GET HELP RIGHT AWAY IF:  Your pain does not get better with medicine.  You have a fever.  Your pain increases and gets worse over 18 hours.  You have new belly (abdominal) pain.  You feel faint or pass out.   MAKE SURE YOU:  Understand these instructions.  Will watch your condition.  Will get help right away if you are not doing well or get worse.

## 2015-11-03 NOTE — ED Provider Notes (Signed)
[facility]-EMERGENCY DEPT Provider Note   CSN: [id number] Arrival date & time: 11/03/15  1202     History   Chief Complaint Chief Complaint  Patient presents with  . Abdominal Pain  . Emesis    HPI Barbara Caldwell is a 40 y.o. female.  The history is provided by the patient and medical records. No language interpreter was used.  Abdominal Pain   Associated symptoms include nausea and vomiting. Pertinent negatives include fever, diarrhea, constipation, dysuria and headaches.  Emesis   Associated symptoms include abdominal pain. Pertinent negatives include no chills, no cough, no diarrhea, no fever and no headaches.   Barbara Caldwell is a 40 y.o. female  with a PMH of migraines who presents to the Emergency Department complaining of constant worsening right lower quadrant pain which intermittently radiates to the back since yesterday associated with nausea and vomiting. No medications taken prior to arrival for symptoms. No history of similar sxs in the past. No fevers, chills, dysuria, urinary urgency/frequency, diarrhea constipation or blood in the stool.     Past Medical History:  Diagnosis Date  . Headache   . Migraine without aura, with intractable migraine, so stated, without mention of status migrainosus 04/06/2013    Patient Active Problem List   Diagnosis Date Noted  . Migraine without aura and without status migrainosus, not intractable 09/08/2015  . Migraine without aura, with intractable migraine, so stated, without mention of status migrainosus 04/06/2013    Past Surgical History:  Procedure Laterality Date  . CESAREAN SECTION  2004    OB History    No data available       Home Medications    Prior to Admission medications   Medication Sig Start Date End Date Taking? Authorizing Provider  carbamazepine (TEGRETOL) 200 MG tablet Take 1 tablet (200 mg total) by mouth at bedtime. 09/08/15  Yes York Spaniel, MD  ibuprofen (ADVIL,MOTRIN) 200 MG tablet Take 600  mg by mouth every 6 (six) hours as needed for headache, mild pain or moderate pain.    Yes Historical Provider, MD  topiramate (TOPAMAX) 100 MG tablet Take 1 tablet (100 mg total) by mouth at bedtime. 09/08/15  Yes York Spaniel, MD  metroNIDAZOLE (FLAGYL) 500 MG tablet Take 1 tablet (500 mg total) by mouth 2 (two) times daily. 11/03/15   Chase Picket Ward, PA-C  naproxen (NAPROSYN) 500 MG tablet Take 1 tablet (500 mg total) by mouth 2 (two) times daily. 11/03/15   Jaime Pilcher Ward, PA-C  ondansetron (ZOFRAN ODT) 4 MG disintegrating tablet Take 1 tablet (4 mg total) by mouth every 8 (eight) hours as needed for nausea or vomiting. 11/03/15   Chase Picket Ward, PA-C  oxyCODONE-acetaminophen (PERCOCET/ROXICET) 5-325 MG tablet Take 1 tablet by mouth every 6 (six) hours as needed for severe pain. 11/03/15   Chase Picket Ward, PA-C  tamsulosin (FLOMAX) 0.4 MG CAPS capsule Take 1 capsule (0.4 mg total) by mouth daily. 11/03/15   Chase Picket Ward, PA-C    Family History Family History  Problem Relation Age of Onset  . Hypertension Mother   . Cataracts Mother   . Hypertension Father   . Gout Father   . Heart disease Father   . Migraines Cousin     Social History Social History  Substance Use Topics  . Smoking status: Current Every Day Smoker    Packs/day: 0.20    Types: Cigarettes  . Smokeless tobacco: Never Used  . Alcohol use 0.0 oz/week  Comment: daily     Allergies   Review of patient's allergies indicates no known allergies.   Review of Systems Review of Systems  Constitutional: Negative for chills and fever.  HENT: Negative for congestion.   Eyes: Negative for visual disturbance.  Respiratory: Negative for cough and shortness of breath.   Cardiovascular: Negative.   Gastrointestinal: Positive for abdominal pain, nausea and vomiting. Negative for blood in stool, constipation and diarrhea.  Genitourinary: Negative for dysuria.  Musculoskeletal: Positive for back  pain. Negative for neck pain.  Skin: Negative for rash.  Neurological: Negative for headaches.     Physical Exam Updated Vital Signs BP 126/87 (BP Location: Right Arm)   Pulse 62   Temp 97.1 F (36.2 C) (Oral)   Resp 15   Ht 5\' 1"  (1.549 m)   Wt 59 kg   SpO2 100%   BMI 24.56 kg/m   Physical Exam  Constitutional: She is oriented to person, place, and time. She appears well-developed and well-nourished. No distress.  HENT:  Head: Normocephalic and atraumatic.  Cardiovascular: Normal rate, regular rhythm and normal heart sounds.   No murmur heard. Pulmonary/Chest: Effort normal and breath sounds normal. No respiratory distress.  Abdominal: Soft. Bowel sounds are normal. She exhibits no distension. There is tenderness. There is guarding.  TTP of right lower quadrant. + psoas sign.   Genitourinary:  Genitourinary Comments: Chaperone present for exam. + white discharge. No adnexal masses, tenderness or fullness. No CMT.  No rashes, lesions, or tenderness to external genitalia. No erythema, injury, or tenderness to vaginal mucosa. No bleeding within vaginal vault.  Musculoskeletal: She exhibits no edema.  Neurological: She is alert and oriented to person, place, and time.  Skin: Skin is warm and dry.  Nursing note and vitals reviewed.    ED Treatments / Results  Labs (all labs ordered are listed, but only abnormal results are displayed) Labs Reviewed  WET PREP, GENITAL - Abnormal; Notable for the following:       Result Value   Trich, Wet Prep PRESENT (*)    Clue Cells Wet Prep HPF POC PRESENT (*)    WBC, Wet Prep HPF POC FEW (*)    All other components within normal limits  COMPREHENSIVE METABOLIC PANEL - Abnormal; Notable for the following:    Glucose, Bld 111 (*)    Creatinine, Ser 1.08 (*)    ALT 8 (*)    All other components within normal limits  CBC - Abnormal; Notable for the following:    WBC 21.3 (*)    Hemoglobin 16.9 (*)    HCT 49.7 (*)    MCV 101.0 (*)      MCH 34.3 (*)    Platelets 415 (*)    All other components within normal limits  URINALYSIS, ROUTINE W REFLEX MICROSCOPIC (NOT AT [facility]) - Abnormal; Notable for the following:    APPearance CLOUDY (*)    Hgb urine dipstick SMALL (*)    Ketones, ur 15 (*)    Leukocytes, UA MODERATE (*)    All other components within normal limits  URINE MICROSCOPIC-ADD ON - Abnormal; Notable for the following:    Squamous Epithelial / LPF 6-30 (*)    Bacteria, UA FEW (*)    All other components within normal limits  LIPASE, BLOOD  I-STAT BETA HCG BLOOD, ED ([facility], [facility], [facility] ONLY)  GC/CHLAMYDIA PROBE AMP ([place]) NOT AT [facility]    EKG  EKG Interpretation None  Radiology Ct Abdomen Pelvis W Contrast  Result Date: 11/03/2015 CLINICAL DATA:  40 year old female with right abdominal pelvic pain for 1 day. Nausea and vomiting for 2 hours. EXAM: CT ABDOMEN AND PELVIS WITH CONTRAST TECHNIQUE: Multidetector CT imaging of the abdomen and pelvis was performed using the standard protocol following bolus administration of intravenous contrast. CONTRAST:  100 cc intravenous Isovue 300 COMPARISON:  None. FINDINGS: Lower chest: Mild bibasilar atelectasis noted. Hepatobiliary: The liver and gallbladder are unremarkable. There is no evidence of biliary dilatation. Pancreas: Unremarkable Spleen: Unremarkable. Adrenals/Urinary Tract: A 5 mm distal right ureteral calculus (2 cm above the UVJ) causes moderate right hydroureteronephrosis. An indeterminate 1.2 cm mass extending off of the lateral mid right kidney is identified. A peripheral calcification within the left upper pole with overlying scar is present. The adrenal glands and bladder are unremarkable. Stomach/Bowel: Unremarkable. No bowel obstruction or definite bowel wall thickening. The appendix is normal. Vascular/Lymphatic: Mild abdominal aortic atherosclerotic calcifications noted without aneurysm. No enlarged lymph nodes identified. Reproductive: Unremarkable  Other: No free fluid, focal collection or pneumoperitoneum. Musculoskeletal: No acute or suspicious abnormality. A mild to moderate apex left lumbar scoliosis is noted. IMPRESSION: 5 mm distal right ureteral calculus (2 cm above the UVJ) causing moderate right hydroureteronephrosis. Indeterminate 1.2 cm right renal mass- elective renal MRI with and without contrast is recommended as malignancy is not excluded. Abdominal aortic atherosclerosis. Electronically Signed   By: Harmon Pier M.D.   On: 11/03/2015 15:21    Procedures Procedures (including critical care time)  Medications Ordered in ED Medications  ondansetron (ZOFRAN) injection 4 mg (4 mg Intravenous Given 11/03/15 1330)  HYDROmorphone (DILAUDID) injection 1 mg (1 mg Intravenous Given 11/03/15 1330)  iopamidol (ISOVUE-300) 61 % injection 30 mL (30 mLs Oral Contrast Given 11/03/15 1500)  sodium chloride 0.9 % bolus 1,000 mL (0 mLs Intravenous Stopped 11/03/15 1733)  iopamidol (ISOVUE-300) 61 % injection 100 mL (100 mLs Intravenous Contrast Given 11/03/15 1500)  ketorolac (TORADOL) 30 MG/ML injection 30 mg (30 mg Intravenous Given 11/03/15 1739)  sodium chloride 0.9 % bolus 1,000 mL (0 mLs Intravenous Stopped 11/03/15 1929)     Initial Impression / Assessment and Plan / ED Course  I have reviewed the triage vital signs and the nursing notes.  Pertinent labs & imaging results that were available during my care of the patient were reviewed by me and considered in my medical decision making (see chart for details).  Clinical Course   Barbara Caldwell is a 39 y.o. female who presents to ED for RLQ abdominal pain which radiates to her back. On exam, patient is afebrile, hemodynamically stable with tenderness to palpation of the right lower quadrant. Labs reviewed and reveal elevated white count of 21.3. CT abdomen/pelvis performed which shows:   IMPRESSION: 5 mm distal right ureteral calculus (2 cm above the UVJ) causing moderate right  hydroureteronephrosis. Indeterminate 1.2 cm right renal mass- elective renal MRI with and without contrast is recommended as malignancy is not excluded.   Patient informed of results and need for outpatient follow up for possible MRI.  UA reviewed which shows moderate leuks and 6-30 white cells along with trichomonas present. Patient denies any vaginal or urinary symptoms. Pelvic exam performed which showed white discharge but no cervical motion or adnexal tenderness. Wet prep reveals + trich and clue cells. Will treat with Flagyl.   Pain adequately managed in the ER. Rx for pain and nausea control given. Follow up care and reasons to return to  ED discussed and all questions answered. Patient discharged to home in satisfactory condition.   Final Clinical Impressions(s) / ED Diagnoses   Final diagnoses:  Kidney stone  Kidney mass  Trichomonosis    New Prescriptions Discharge Medication List as of 11/03/2015  7:17 PM    START taking these medications   Details  metroNIDAZOLE (FLAGYL) 500 MG tablet Take 1 tablet (500 mg total) by mouth 2 (two) times daily., Starting Thu 11/03/2015, Print    naproxen (NAPROSYN) 500 MG tablet Take 1 tablet (500 mg total) by mouth 2 (two) times daily., Starting Thu 11/03/2015, Print    ondansetron (ZOFRAN ODT) 4 MG disintegrating tablet Take 1 tablet (4 mg total) by mouth every 8 (eight) hours as needed for nausea or vomiting., Starting Thu 11/03/2015, Print    oxyCODONE-acetaminophen (PERCOCET/ROXICET) 5-325 MG tablet Take 1 tablet by mouth every 6 (six) hours as needed for severe pain., Starting Thu 11/03/2015, Print    tamsulosin (FLOMAX) 0.4 MG CAPS capsule Take 1 capsule (0.4 mg total) by mouth daily., Starting Thu 11/03/2015, Print         CIT GroupJaime Pilcher Ward, PA-C 11/03/15 2006    Shaune Pollackameron Isaacs, MD 11/05/15 [phone]

## 2015-11-03 NOTE — ED Triage Notes (Addendum)
Patient comes from home with her sister with c/o RLQ pain which began yesterday, radiating to back.  Patient began having N/V 2 hours ago, but denies diarrhea and fever.  Patient has no abdominal surgical history except for a c-section.  She has a history of Migraines and takes Topamax for that.  Patient denies drug and alcohol use.  Last EtOH use was 2 days ago.  Patient's abdomen is soft, but exceptionally painful to palpation RLQ.

## 2015-11-04 LAB — GC/CHLAMYDIA PROBE AMP (~~LOC~~) NOT AT ARMC
Chlamydia: NEGATIVE
Neisseria Gonorrhea: NEGATIVE

## 2015-12-11 ENCOUNTER — Encounter (HOSPITAL_COMMUNITY): Payer: Self-pay | Admitting: Emergency Medicine

## 2015-12-11 ENCOUNTER — Emergency Department (HOSPITAL_COMMUNITY)
Admission: EM | Admit: 2015-12-11 | Discharge: 2015-12-12 | Disposition: A | Discharge status: Home / Self Care | Payer: Medicaid Other | Attending: Emergency Medicine | Admitting: Emergency Medicine

## 2015-12-11 ENCOUNTER — Emergency Department (HOSPITAL_COMMUNITY): Payer: Medicaid Other

## 2015-12-11 DIAGNOSIS — F17290 Nicotine dependence, other tobacco product, uncomplicated: Secondary | ICD-10-CM | POA: Insufficient documentation

## 2015-12-11 DIAGNOSIS — N23 Unspecified renal colic: Secondary | ICD-10-CM

## 2015-12-11 DIAGNOSIS — N201 Calculus of ureter: Secondary | ICD-10-CM | POA: Diagnosis not present

## 2015-12-11 DIAGNOSIS — Z79899 Other long term (current) drug therapy: Secondary | ICD-10-CM | POA: Diagnosis not present

## 2015-12-11 DIAGNOSIS — R109 Unspecified abdominal pain: Secondary | ICD-10-CM | POA: Diagnosis present

## 2015-12-11 DIAGNOSIS — N1330 Unspecified hydronephrosis: Secondary | ICD-10-CM | POA: Diagnosis not present

## 2015-12-11 LAB — LIPASE, BLOOD: LIPASE: 20 U/L (ref 11–51)

## 2015-12-11 LAB — COMPREHENSIVE METABOLIC PANEL
ALBUMIN: 4.4 g/dL (ref 3.5–5.0)
ALT: 7 U/L — ABNORMAL LOW (ref 14–54)
AST: 17 U/L (ref 15–41)
Alkaline Phosphatase: 62 U/L (ref 38–126)
Anion gap: 9 (ref 5–15)
BUN: 11 mg/dL (ref 6–20)
CHLORIDE: 108 mmol/L (ref 101–111)
CO2: 20 mmol/L — AB (ref 22–32)
Calcium: 9.5 mg/dL (ref 8.9–10.3)
Creatinine, Ser: 0.88 mg/dL (ref 0.44–1.00)
GFR calc Af Amer: >60 mL/min (ref >60)
Glucose, Bld: 116 mg/dL — ABNORMAL HIGH (ref 65–99)
POTASSIUM: 3.6 mmol/L (ref 3.5–5.1)
SODIUM: 137 mmol/L (ref 135–145)
Total Bilirubin: 1 mg/dL (ref 0.3–1.2)
Total Protein: 7.5 g/dL (ref 6.5–8.1)

## 2015-12-11 LAB — CBC
HEMATOCRIT: 47.2 % — AB (ref 36.0–46.0)
Hemoglobin: 16.6 g/dL — ABNORMAL HIGH (ref 12.0–15.0)
MCH: 35 pg — ABNORMAL HIGH (ref 26.0–34.0)
MCHC: 35.2 g/dL (ref 30.0–36.0)
MCV: 99.6 fL (ref 78.0–100.0)
Platelets: 391 10*3/uL (ref 150–400)
RBC: 4.74 MIL/uL (ref 3.87–5.11)
RDW: 13.7 % (ref 11.5–15.5)
WBC: 18.5 10*3/uL — AB (ref 4.0–10.5)

## 2015-12-11 LAB — I-STAT BETA HCG BLOOD, ED (MC, WL, AP ONLY)

## 2015-12-11 MED ORDER — TAMSULOSIN HCL 0.4 MG PO CAPS
0.4000 mg | ORAL_CAPSULE | Freq: Once | ORAL | Status: AC
  Administered 2015-12-11: 0.4 mg via ORAL
  Filled 2015-12-11: qty 1

## 2015-12-11 MED ORDER — MORPHINE SULFATE (PF) 4 MG/ML IV SOLN
4.0000 mg | Freq: Once | INTRAVENOUS | Status: AC
  Administered 2015-12-11: 4 mg via INTRAVENOUS
  Filled 2015-12-11: qty 1

## 2015-12-11 MED ORDER — ONDANSETRON HCL 4 MG/2ML IJ SOLN
4.0000 mg | Freq: Once | INTRAMUSCULAR | Status: AC
  Administered 2015-12-11: 4 mg via INTRAVENOUS
  Filled 2015-12-11: qty 2

## 2015-12-11 MED ORDER — ONDANSETRON 4 MG PO TBDP
4.0000 mg | ORAL_TABLET | Freq: Once | ORAL | Status: AC | PRN
  Administered 2015-12-11: 4 mg via ORAL
  Filled 2015-12-11: qty 1

## 2015-12-11 MED ORDER — SODIUM CHLORIDE 0.9 % IV BOLUS (SEPSIS)
1000.0000 mL | Freq: Once | INTRAVENOUS | Status: AC
  Administered 2015-12-11: 1000 mL via INTRAVENOUS

## 2015-12-11 MED ORDER — KETOROLAC TROMETHAMINE 15 MG/ML IJ SOLN
15.0000 mg | Freq: Once | INTRAMUSCULAR | Status: AC
  Administered 2015-12-11: 15 mg via INTRAVENOUS
  Filled 2015-12-11: qty 1

## 2015-12-11 NOTE — ED Notes (Signed)
Pt having active vomiting at triage. Pt given ODT Zofran 4mg  for nausea/vomiting

## 2015-12-11 NOTE — ED Triage Notes (Signed)
Pt reports right sided flank pain that started several hours ago. Pt states pain started suddenly along with nausea and vomiting.

## 2015-12-12 LAB — URINALYSIS, ROUTINE W REFLEX MICROSCOPIC
GLUCOSE, UA: NEGATIVE mg/dL
KETONES UR: NEGATIVE mg/dL
Nitrite: NEGATIVE
PROTEIN: 30 mg/dL — AB
Specific Gravity, Urine: 1.027 (ref 1.005–1.030)
pH: 6 (ref 5.0–8.0)

## 2015-12-12 LAB — URINE MICROSCOPIC-ADD ON

## 2015-12-12 MED ORDER — CEPHALEXIN 500 MG PO CAPS: 500.0000 mg | ORAL_CAPSULE | Freq: Three times a day (TID) | ORAL | 0 refills | Status: DC

## 2015-12-12 MED ORDER — OXYCODONE-ACETAMINOPHEN 5-325 MG PO TABS
2.0000 | ORAL_TABLET | Freq: Once | ORAL | Status: AC
  Administered 2015-12-12: 2 via ORAL
  Filled 2015-12-12: qty 2

## 2015-12-12 MED ORDER — NAPROXEN 375 MG PO TABS: 375.0000 mg | ORAL_TABLET | Freq: Two times a day (BID) | ORAL | 0 refills | Status: AC | PRN

## 2015-12-12 MED ORDER — HYDROCODONE-ACETAMINOPHEN 5-325 MG PO TABS: 1.0000 | ORAL_TABLET | ORAL | 0 refills | Status: AC | PRN

## 2015-12-12 MED ORDER — ONDANSETRON HCL 4 MG PO TABS: 4.0000 mg | ORAL_TABLET | Freq: Three times a day (TID) | ORAL | 0 refills | Status: AC | PRN

## 2015-12-12 MED ORDER — DEXTROSE 5 % IV SOLN
1.0000 g | Freq: Once | INTRAVENOUS | Status: AC
  Administered 2015-12-12: 1 g via INTRAVENOUS
  Filled 2015-12-12: qty 10

## 2015-12-12 NOTE — ED Provider Notes (Signed)
[facility]-EMERGENCY DEPT Provider Note   CSN: [id number] Arrival date & time: 12/11/15  2033     History   Chief Complaint Chief Complaint  Patient presents with  . Flank Pain  . Abdominal Pain    HPI Barbara Caldwell is a 40 y.o. female.  HPI 40 yo F with h/o renal stones here with acute onset right flank pain several hours ago. Pt states she was in usual state of health then experienced acute onset severe right flank pain. Pain is aching, gnawing, cramp like pain that occasionally radiates to the groin. No vaginal bleeding or discharge. Denies any fever, dysuria, or frequency prior to the episode. Pain is made worse with movement. No alleviating factors. Denies any recent trauma.  She has had nausea and NBNB emesis.  Past Medical History:  Diagnosis Date  . Headache   . Migraine without aura, with intractable migraine, so stated, without mention of status migrainosus 04/06/2013    Patient Active Problem List   Diagnosis Date Noted  . Migraine without aura and without status migrainosus, not intractable 09/08/2015  . Migraine without aura, with intractable migraine, so stated, without mention of status migrainosus 04/06/2013    Past Surgical History:  Procedure Laterality Date  . CESAREAN SECTION  2004    OB History    No data available       Home Medications    Prior to Admission medications   Medication Sig Start Date End Date Taking? Authorizing Provider  carbamazepine (TEGRETOL) 200 MG tablet Take 1 tablet (200 mg total) by mouth at bedtime. 09/08/15   York Spaniel, MD  cephALEXin (KEFLEX) 500 MG capsule Take 1 capsule (500 mg total) by mouth 3 (three) times daily. 12/12/15 12/19/15  Shaune Pollack, MD  HYDROcodone-acetaminophen (NORCO/VICODIN) 5-325 MG tablet Take 1-2 tablets by mouth every 4 (four) hours as needed for severe pain. 12/12/15   Shaune Pollack, MD  ibuprofen (ADVIL,MOTRIN) 200 MG tablet Take 600 mg by mouth every 6 (six) hours as needed for headache,  mild pain or moderate pain.     Historical Provider, MD  metroNIDAZOLE (FLAGYL) 500 MG tablet Take 1 tablet (500 mg total) by mouth 2 (two) times daily. 11/03/15   Jaime Pilcher Ward, PA-C  naproxen (NAPROSYN) 375 MG tablet Take 1 tablet (375 mg total) by mouth 2 (two) times daily as needed for moderate pain. 12/12/15 12/19/15  Shaune Pollack, MD  ondansetron (ZOFRAN ODT) 4 MG disintegrating tablet Take 1 tablet (4 mg total) by mouth every 8 (eight) hours as needed for nausea or vomiting. 11/03/15   Jaime Pilcher Ward, PA-C  ondansetron (ZOFRAN) 4 MG tablet Take 1 tablet (4 mg total) by mouth every 8 (eight) hours as needed for nausea or vomiting. 12/12/15   Shaune Pollack, MD  oxyCODONE-acetaminophen (PERCOCET/ROXICET) 5-325 MG tablet Take 1 tablet by mouth every 6 (six) hours as needed for severe pain. 11/03/15   Chase Picket Ward, PA-C  tamsulosin (FLOMAX) 0.4 MG CAPS capsule Take 1 capsule (0.4 mg total) by mouth daily. 11/03/15   Jaime Pilcher Ward, PA-C  topiramate (TOPAMAX) 100 MG tablet Take 1 tablet (100 mg total) by mouth at bedtime. 09/08/15   York Spaniel, MD    Family History Family History  Problem Relation Age of Onset  . Hypertension Mother   . Cataracts Mother   . Hypertension Father   . Gout Father   . Heart disease Father   . Migraines Cousin     Social History Social History  Substance Use Topics  . Smoking status: Current Every Day Smoker    Packs/day: 0.20    Types: Cigars  . Smokeless tobacco: Never Used  . Alcohol use 0.0 oz/week     Comment: daily     Allergies   Patient has no known allergies.   Review of Systems Review of Systems  Constitutional: Negative for chills, fatigue and fever.  HENT: Negative for congestion and rhinorrhea.   Eyes: Negative for visual disturbance.  Respiratory: Negative for cough, shortness of breath and wheezing.   Cardiovascular: Negative for chest pain and leg swelling.  Gastrointestinal: Positive for nausea and  vomiting. Negative for abdominal pain and diarrhea.  Genitourinary: Positive for flank pain. Negative for dysuria.  Musculoskeletal: Negative for neck pain and neck stiffness.  Skin: Negative for rash and wound.  Allergic/Immunologic: Negative for immunocompromised state.  Neurological: Negative for syncope, weakness and headaches.  All other systems reviewed and are negative.    Physical Exam Updated Vital Signs BP 127/85   Pulse 60   Temp 97.9 F (36.6 C) (Oral)   Resp 18   Ht 5\' 1"  (1.549 m)   Wt 138 lb (62.6 kg)   LMP 11/27/2015   SpO2 97%   BMI 26.07 kg/m   Physical Exam  Constitutional: She appears well-developed and well-nourished. She appears distressed (appears in pain).  HENT:  Head: Normocephalic.  Mouth/Throat: Oropharynx is clear and moist. No oropharyngeal exudate.  Eyes: Conjunctivae are normal. Pupils are equal, round, and reactive to light.  Neck: Neck supple.  Cardiovascular: Normal rate, regular rhythm and normal heart sounds.  Exam reveals no friction rub.   No murmur heard. Pulmonary/Chest: Effort normal and breath sounds normal. No respiratory distress. She has no wheezes. She has no rales.  Abdominal: Soft. Bowel sounds are normal. She exhibits no distension. There is no tenderness.  Musculoskeletal: She exhibits no edema.  Neurological: She is alert. No sensory deficit.  Skin: Skin is warm. Capillary refill takes less than 2 seconds. No rash noted.  Nursing note and vitals reviewed.    ED Treatments / Results  Labs (all labs ordered are listed, but only abnormal results are displayed) Labs Reviewed  COMPREHENSIVE METABOLIC PANEL - Abnormal; Notable for the following:       Result Value   CO2 20 (*)    Glucose, Bld 116 (*)    ALT 7 (*)    All other components within normal limits  CBC - Abnormal; Notable for the following:    WBC 18.5 (*)    Hemoglobin 16.6 (*)    HCT 47.2 (*)    MCH 35.0 (*)    All other components within normal limits    URINALYSIS, ROUTINE W REFLEX MICROSCOPIC (NOT AT [facility]) - Abnormal; Notable for the following:    Color, Urine AMBER (*)    APPearance CLOUDY (*)    Hgb urine dipstick LARGE (*)    Bilirubin Urine SMALL (*)    Protein, ur 30 (*)    Leukocytes, UA TRACE (*)    All other components within normal limits  URINE MICROSCOPIC-ADD ON - Abnormal; Notable for the following:    Squamous Epithelial / LPF 6-30 (*)    Bacteria, UA MANY (*)    Crystals CA OXALATE CRYSTALS (*)    All other components within normal limits  URINE CULTURE  LIPASE, BLOOD  I-STAT BETA HCG BLOOD, ED ([facility], [facility], [facility] ONLY)    EKG  EKG Interpretation None  Radiology Ct Renal Stone Study  Result Date: 12/11/2015 CLINICAL DATA:  Acute onset of right flank pain, nausea and vomiting. Initial encounter. EXAM: CT ABDOMEN AND PELVIS WITHOUT CONTRAST TECHNIQUE: Multidetector CT imaging of the abdomen and pelvis was performed following the standard protocol without IV contrast. COMPARISON:  CT of the abdomen and pelvis from 11/03/2015 FINDINGS: Lower chest: Minimal bibasilar atelectasis is noted. The visualized portions of the mediastinum are unremarkable. Hepatobiliary: The liver is unremarkable in appearance. The gallbladder is unremarkable in appearance. The common bile duct remains normal in caliber. Pancreas: The pancreas is within normal limits. Spleen: The spleen is unremarkable in appearance. Adrenals/Urinary Tract: The adrenal glands are unremarkable in appearance. Mild right-sided hydronephrosis is noted, with right-sided perinephric stranding and fluid, and prominence of the right ureter along its entire course. An obstructing 6 mm stone is noted distally at the right vesicoureteral junction. A parenchymal calcification is noted at the upper pole of the left kidney, with associated scarring. No nonobstructing renal stones are identified. Stomach/Bowel: The stomach is unremarkable in appearance. The small bowel is within  normal limits. The appendix is normal in caliber, without evidence of appendicitis. The colon is decompressed and unremarkable in appearance. Vascular/Lymphatic: Minimal calcification is seen at the distal abdominal aorta. No retroperitoneal or pelvic sidewall lymphadenopathy is seen. Reproductive: The bladder is decompressed and not well assessed. The uterus is unremarkable in appearance. The ovaries are relatively symmetric. No suspicious adnexal masses are seen. Other: No additional soft tissue abnormalities are seen. Musculoskeletal: No acute osseous abnormalities are identified. The visualized musculature is unremarkable in appearance. IMPRESSION: Mild right-sided hydronephrosis, with right-sided perinephric stranding and fluid. Obstructing 6 mm stone noted distally at the right vesicoureteral junction. Electronically Signed   By: Roanna Raider M.D.   On: 12/11/2015 22:59    Procedures Procedures (including critical care time)  Medications Ordered in ED Medications  ondansetron (ZOFRAN-ODT) disintegrating tablet 4 mg (4 mg Oral Given 12/11/15 2047)  sodium chloride 0.9 % bolus 1,000 mL (0 mLs Intravenous Stopped 12/12/15 0025)  morphine 4 MG/ML injection 4 mg (4 mg Intravenous Given 12/11/15 2204)  ketorolac (TORADOL) 15 MG/ML injection 15 mg (15 mg Intravenous Given 12/11/15 2204)  ondansetron (ZOFRAN) injection 4 mg (4 mg Intravenous Given 12/11/15 2204)  morphine 4 MG/ML injection 4 mg (4 mg Intravenous Given 12/11/15 2340)  tamsulosin (FLOMAX) capsule 0.4 mg (0.4 mg Oral Given 12/11/15 2340)  oxyCODONE-acetaminophen (PERCOCET/ROXICET) 5-325 MG per tablet 2 tablet (2 tablets Oral Given 12/12/15 0033)  cefTRIAXone (ROCEPHIN) 1 g in dextrose 5 % 50 mL IVPB (0 g Intravenous Stopped 12/12/15 0110)     Initial Impression / Assessment and Plan / ED Course  I have reviewed the triage vital signs and the nursing notes.  Pertinent labs & imaging results that were available during my care of  the patient were reviewed by me and considered in my medical decision making (see chart for details).  Clinical Course     40 yo AAF with PMHx of renal stones here with acute onset right flank pain. Labs show mild dehydration and leukocytosis, likely reactive. CT scan shows obstructing 6 mm stone, nearly passed at right UVJ with mild hydro. No evidence of AKI. UA shows 6-30 WBCs but many squams, and this is c/w her prior UA. Given absence of any fever, preceding symptoms, well appearance, and stable VS in ED, low suspicion for infected stone. Will treat conservatively with ABX, however, given size of stone with hydro. Otherwise, pain markedly  improved with IVF and analgesia. She is now tolerating PO, and pain is controlled with PO analgesia. Discussed management options with pt. Given absence of any red flag sx, will treat with analgesia, antiemetics, keflex for possible lower UTI (no pyelo), and d/c home with GOOD return precautions. Pt in agreement. UCx sent.  Final Clinical Impressions(s) / ED Diagnoses   Final diagnoses:  Ureterolithiasis  Renal colic    New Prescriptions Discharge Medication List as of 12/12/2015 12:50 AM    START taking these medications   Details  cephALEXin (KEFLEX) 500 MG capsule Take 1 capsule (500 mg total) by mouth 3 (three) times daily., Starting Mon 12/12/2015, Until Mon 12/19/2015, Print    HYDROcodone-acetaminophen (NORCO/VICODIN) 5-325 MG tablet Take 1-2 tablets by mouth every 4 (four) hours as needed for severe pain., Starting Mon 12/12/2015, Print    ondansetron (ZOFRAN) 4 MG tablet Take 1 tablet (4 mg total) by mouth every 8 (eight) hours as needed for nausea or vomiting., Starting Mon 12/12/2015, Print         Shaune Pollackameron Hira Trent, MD 12/12/15 [phone]

## 2015-12-12 NOTE — ED Notes (Signed)
Pt was given ginger ale for po challenge---- taking sips and tolerating well; pt denies nausea at this time.

## 2015-12-14 LAB — URINE CULTURE

## 2015-12-17 ENCOUNTER — Emergency Department (HOSPITAL_COMMUNITY)
Admission: EM | Admit: 2015-12-17 | Discharge: 2015-12-17 | Disposition: A | Discharge status: Home / Self Care | Payer: Medicaid Other | Attending: Emergency Medicine | Admitting: Emergency Medicine

## 2015-12-17 ENCOUNTER — Encounter (HOSPITAL_COMMUNITY): Payer: Self-pay | Admitting: Nurse Practitioner

## 2015-12-17 DIAGNOSIS — Z79899 Other long term (current) drug therapy: Secondary | ICD-10-CM | POA: Diagnosis not present

## 2015-12-17 DIAGNOSIS — F17290 Nicotine dependence, other tobacco product, uncomplicated: Secondary | ICD-10-CM | POA: Insufficient documentation

## 2015-12-17 DIAGNOSIS — N200 Calculus of kidney: Secondary | ICD-10-CM | POA: Insufficient documentation

## 2015-12-17 LAB — URINALYSIS, ROUTINE W REFLEX MICROSCOPIC
Bilirubin Urine: NEGATIVE
Glucose, UA: NEGATIVE mg/dL
Hgb urine dipstick: NEGATIVE
Ketones, ur: NEGATIVE mg/dL
LEUKOCYTES UA: NEGATIVE
Nitrite: NEGATIVE
PH: 6 (ref 5.0–8.0)
Protein, ur: NEGATIVE mg/dL
Specific Gravity, Urine: 1.005 (ref 1.005–1.030)

## 2015-12-17 MED ORDER — HYDROMORPHONE HCL 1 MG/ML IJ SOLN
1.0000 mg | Freq: Once | INTRAMUSCULAR | Status: AC
  Administered 2015-12-17: 1 mg via INTRAMUSCULAR
  Filled 2015-12-17: qty 1

## 2015-12-17 MED ORDER — KETOROLAC TROMETHAMINE 60 MG/2ML IM SOLN
60.0000 mg | Freq: Once | INTRAMUSCULAR | Status: AC
  Administered 2015-12-17: 60 mg via INTRAMUSCULAR
  Filled 2015-12-17: qty 2

## 2015-12-17 NOTE — ED Provider Notes (Signed)
[facility]-EMERGENCY DEPT Provider Note   CSN: [id number] Arrival date & time: 12/17/15  1052     History   Chief Complaint Chief Complaint  Patient presents with  . Nephrolithiasis    right    HPI Barbara Caldwell is a 40 y.o. female.  40 year old female presents after a fever kidney stone several days ago with continued right sided colicky like pain. Denies any dysuria or hematuria. No frequency noted. Was prescribed analgesics which she is not taking. No vaginal bleeding or discharge. Pain is persistent and nothing makes it better or worse. No fever or chills or vomiting.      Past Medical History:  Diagnosis Date  . Headache   . Migraine without aura, with intractable migraine, so stated, without mention of status migrainosus 04/06/2013    Patient Active Problem List   Diagnosis Date Noted  . Migraine without aura and without status migrainosus, not intractable 09/08/2015  . Migraine without aura, with intractable migraine, so stated, without mention of status migrainosus 04/06/2013    Past Surgical History:  Procedure Laterality Date  . CESAREAN SECTION  2004    OB History    No data available       Home Medications    Prior to Admission medications   Medication Sig Start Date End Date Taking? Authorizing Provider  carbamazepine (TEGRETOL) 200 MG tablet Take 1 tablet (200 mg total) by mouth at bedtime. 09/08/15   York Spanielharles K Willis, MD  cephALEXin (KEFLEX) 500 MG capsule Take 1 capsule (500 mg total) by mouth 3 (three) times daily. 12/12/15 12/19/15  Shaune Pollackameron Isaacs, MD  HYDROcodone-acetaminophen (NORCO/VICODIN) 5-325 MG tablet Take 1-2 tablets by mouth every 4 (four) hours as needed for severe pain. 12/12/15   Shaune Pollackameron Isaacs, MD  ibuprofen (ADVIL,MOTRIN) 200 MG tablet Take 600 mg by mouth every 6 (six) hours as needed for headache, mild pain or moderate pain.     Historical Provider, MD  metroNIDAZOLE (FLAGYL) 500 MG tablet Take 1 tablet (500 mg total) by mouth 2 (two)  times daily. 11/03/15   Jaime Pilcher Ward, PA-C  naproxen (NAPROSYN) 375 MG tablet Take 1 tablet (375 mg total) by mouth 2 (two) times daily as needed for moderate pain. 12/12/15 12/19/15  Shaune Pollackameron Isaacs, MD  ondansetron (ZOFRAN ODT) 4 MG disintegrating tablet Take 1 tablet (4 mg total) by mouth every 8 (eight) hours as needed for nausea or vomiting. 11/03/15   Jaime Pilcher Ward, PA-C  ondansetron (ZOFRAN) 4 MG tablet Take 1 tablet (4 mg total) by mouth every 8 (eight) hours as needed for nausea or vomiting. 12/12/15   Shaune Pollackameron Isaacs, MD  oxyCODONE-acetaminophen (PERCOCET/ROXICET) 5-325 MG tablet Take 1 tablet by mouth every 6 (six) hours as needed for severe pain. 11/03/15   Chase PicketJaime Pilcher Ward, PA-C  tamsulosin (FLOMAX) 0.4 MG CAPS capsule Take 1 capsule (0.4 mg total) by mouth daily. 11/03/15   Jaime Pilcher Ward, PA-C  topiramate (TOPAMAX) 100 MG tablet Take 1 tablet (100 mg total) by mouth at bedtime. 09/08/15   York Spanielharles K Willis, MD    Family History Family History  Problem Relation Age of Onset  . Hypertension Mother   . Cataracts Mother   . Hypertension Father   . Gout Father   . Heart disease Father   . Migraines Cousin     Social History Social History  Substance Use Topics  . Smoking status: Current Every Day Smoker    Packs/day: 0.20    Types: Cigars  . Smokeless tobacco:  Never Used  . Alcohol use 0.0 oz/week     Comment: daily     Allergies   Patient has no known allergies.   Review of Systems Review of Systems  All other systems reviewed and are negative.    Physical Exam Updated Vital Signs BP 119/83 (BP Location: Right Arm)   Pulse 94   Temp 98.2 F (36.8 C) (Oral)   Resp 20   LMP 11/27/2015   SpO2 93%   Physical Exam  Constitutional: She is oriented to person, place, and time. She appears well-developed and well-nourished.  Non-toxic appearance. No distress.  HENT:  Head: Normocephalic and atraumatic.  Eyes: Conjunctivae, EOM and lids are  normal. Pupils are equal, round, and reactive to light.  Neck: Normal range of motion. Neck supple. No tracheal deviation present. No thyroid mass present.  Cardiovascular: Normal rate, regular rhythm and normal heart sounds.  Exam reveals no gallop.   No murmur heard. Pulmonary/Chest: Effort normal and breath sounds normal. No stridor. No respiratory distress. She has no decreased breath sounds. She has no wheezes. She has no rhonchi. She has no rales.  Abdominal: Soft. Normal appearance and bowel sounds are normal. She exhibits no distension. There is no tenderness. There is no rigidity, no rebound, no guarding and no CVA tenderness.  Musculoskeletal: Normal range of motion. She exhibits no edema or tenderness.  Neurological: She is alert and oriented to person, place, and time. She has normal strength. No cranial nerve deficit or sensory deficit. GCS eye subscore is 4. GCS verbal subscore is 5. GCS motor subscore is 6.  Skin: Skin is warm and dry. No abrasion and no rash noted.  Psychiatric: She has a normal mood and affect. Her speech is normal and behavior is normal.  Nursing note and vitals reviewed.    ED Treatments / Results  Labs (all labs ordered are listed, but only abnormal results are displayed) Labs Reviewed  URINALYSIS, ROUTINE W REFLEX MICROSCOPIC (NOT AT [facility])    EKG  EKG Interpretation None       Radiology No results found.  Procedures Procedures (including critical care time)  Medications Ordered in ED Medications - No data to display   Initial Impression / Assessment and Plan / ED Course  I have reviewed the triage vital signs and the nursing notes.  Pertinent labs & imaging results that were available during my care of the patient were reviewed by me and considered in my medical decision making (see chart for details).  Clinical Course     Medication medicated for pain here feels better. We'll follow-up with the urologist  Final Clinical  Impressions(s) / ED Diagnoses   Final diagnoses:  None    New Prescriptions New Prescriptions   No medications on file     Lorre NickAnthony Tyrick Dunagan, MD 12/17/15 1342

## 2015-12-17 NOTE — ED Triage Notes (Signed)
Pt presents to [facility]-ED for worsening pain since being diagnosed with a kidney stone earlier this week. She has tried medications prescribed and pain is not improving.

## 2015-12-19 ENCOUNTER — Ambulatory Visit (HOSPITAL_COMMUNITY): Payer: Medicaid Other | Admitting: Anesthesiology

## 2015-12-19 ENCOUNTER — Encounter (HOSPITAL_COMMUNITY)
Admission: AD | Disposition: A | Discharge status: Home / Self Care | Payer: Self-pay | Source: Ambulatory Visit | Attending: Urology

## 2015-12-19 ENCOUNTER — Ambulatory Visit (HOSPITAL_COMMUNITY)
Admission: AD | Admit: 2015-12-19 | Discharge: 2015-12-20 | Disposition: A | Discharge status: Home / Self Care | Payer: Medicaid Other | Source: Ambulatory Visit | Attending: Urology | Admitting: Urology

## 2015-12-19 ENCOUNTER — Encounter (HOSPITAL_COMMUNITY): Payer: Self-pay | Admitting: *Deleted

## 2015-12-19 ENCOUNTER — Other Ambulatory Visit: Payer: Self-pay | Admitting: Urology

## 2015-12-19 DIAGNOSIS — N201 Calculus of ureter: Secondary | ICD-10-CM | POA: Diagnosis present

## 2015-12-19 DIAGNOSIS — Z87891 Personal history of nicotine dependence: Secondary | ICD-10-CM | POA: Diagnosis not present

## 2015-12-19 HISTORY — DX: Personal history of urinary calculi: Z87.442

## 2015-12-19 HISTORY — PX: CYSTOSCOPY WITH RETROGRADE PYELOGRAM, URETEROSCOPY AND STENT PLACEMENT: SHX5789

## 2015-12-19 LAB — HCG, SERUM, QUALITATIVE: Preg, Serum: NEGATIVE

## 2015-12-19 SURGERY — CYSTOURETEROSCOPY, WITH RETROGRADE PYELOGRAM AND STENT INSERTION
Anesthesia: General | Laterality: Right

## 2015-12-19 MED ORDER — CEFAZOLIN SODIUM-DEXTROSE 2-4 GM/100ML-% IV SOLN
INTRAVENOUS | Status: AC
  Filled 2015-12-19: qty 100

## 2015-12-19 MED ORDER — BELLADONNA ALKALOIDS-OPIUM 16.2-60 MG RE SUPP
RECTAL | Status: AC
  Filled 2015-12-19: qty 1

## 2015-12-19 MED ORDER — ONDANSETRON HCL 4 MG/2ML IJ SOLN
INTRAMUSCULAR | Status: AC
  Filled 2015-12-19: qty 2

## 2015-12-19 MED ORDER — PROMETHAZINE HCL 25 MG/ML IJ SOLN
6.2500 mg | INTRAMUSCULAR | Status: DC | PRN
Start: 2015-12-19 — End: 2015-12-20

## 2015-12-19 MED ORDER — FENTANYL CITRATE (PF) 100 MCG/2ML IJ SOLN: 25.0000 ug | INTRAMUSCULAR | Status: DC | PRN

## 2015-12-19 MED ORDER — BELLADONNA ALKALOIDS-OPIUM 16.2-60 MG RE SUPP
RECTAL | Status: DC | PRN
  Administered 2015-12-19: 1 via RECTAL

## 2015-12-19 MED ORDER — LIDOCAINE HCL (CARDIAC) 10 MG/ML IV SOLN
INTRAVENOUS | Status: DC | PRN
  Administered 2015-12-19: 100 mg via INTRAVENOUS

## 2015-12-19 MED ORDER — KETOROLAC TROMETHAMINE 30 MG/ML IJ SOLN
INTRAMUSCULAR | Status: AC
  Filled 2015-12-19: qty 1

## 2015-12-19 MED ORDER — LACTATED RINGERS IV SOLN
INTRAVENOUS | Status: DC | PRN
  Administered 2015-12-19: 20:00:00 via INTRAVENOUS

## 2015-12-19 MED ORDER — FENTANYL CITRATE (PF) 100 MCG/2ML IJ SOLN
INTRAMUSCULAR | Status: AC
  Filled 2015-12-19: qty 2

## 2015-12-19 MED ORDER — KETOROLAC TROMETHAMINE 30 MG/ML IJ SOLN
INTRAMUSCULAR | Status: DC | PRN
  Administered 2015-12-19: 30 mg via INTRAVENOUS

## 2015-12-19 MED ORDER — MIDAZOLAM HCL 2 MG/2ML IJ SOLN
INTRAMUSCULAR | Status: AC
  Filled 2015-12-19: qty 2

## 2015-12-19 MED ORDER — DEXAMETHASONE SODIUM PHOSPHATE 10 MG/ML IJ SOLN
INTRAMUSCULAR | Status: AC
  Filled 2015-12-19: qty 1

## 2015-12-19 MED ORDER — PROPOFOL 10 MG/ML IV BOLUS
INTRAVENOUS | Status: DC | PRN
  Administered 2015-12-19: 200 mg via INTRAVENOUS

## 2015-12-19 MED ORDER — CEFAZOLIN SODIUM-DEXTROSE 2-4 GM/100ML-% IV SOLN
2.0000 g | INTRAVENOUS | Status: AC
  Administered 2015-12-19: 2 g via INTRAVENOUS
  Filled 2015-12-19: qty 100

## 2015-12-19 MED ORDER — FENTANYL CITRATE (PF) 100 MCG/2ML IJ SOLN
INTRAMUSCULAR | Status: DC | PRN
  Administered 2015-12-19 (×2): 50 ug via INTRAVENOUS

## 2015-12-19 MED ORDER — ONDANSETRON HCL 4 MG/2ML IJ SOLN
INTRAMUSCULAR | Status: DC | PRN
  Administered 2015-12-19: 4 mg via INTRAVENOUS

## 2015-12-19 MED ORDER — IOHEXOL 300 MG/ML  SOLN
INTRAMUSCULAR | Status: DC | PRN
  Administered 2015-12-19: 2 mL via URETHRAL

## 2015-12-19 MED ORDER — ACETAMINOPHEN 10 MG/ML IV SOLN
INTRAVENOUS | Status: AC
  Filled 2015-12-19: qty 100

## 2015-12-19 MED ORDER — PROPOFOL 10 MG/ML IV BOLUS
INTRAVENOUS | Status: AC
  Filled 2015-12-19: qty 40

## 2015-12-19 MED ORDER — SODIUM CHLORIDE 0.9 % IR SOLN
Status: DC | PRN
  Administered 2015-12-19: 3000 mL via INTRAVESICAL

## 2015-12-19 MED ORDER — KETOROLAC TROMETHAMINE 30 MG/ML IJ SOLN: 30.0000 mg | Freq: Once | INTRAMUSCULAR | Status: AC | PRN

## 2015-12-19 MED ORDER — ACETAMINOPHEN 10 MG/ML IV SOLN
INTRAVENOUS | Status: DC | PRN
  Administered 2015-12-19: 1000 mg via INTRAVENOUS

## 2015-12-19 MED ORDER — DEXAMETHASONE SODIUM PHOSPHATE 10 MG/ML IJ SOLN
INTRAMUSCULAR | Status: DC | PRN
  Administered 2015-12-19: 10 mg via INTRAVENOUS

## 2015-12-19 SURGICAL SUPPLY — 23 items
BAG URO CATCHER STRL LF (MISCELLANEOUS) ×3 IMPLANT
BASKET LASER NITINOL 1.9FR (BASKET) IMPLANT
BASKET STONE NCOMPASS (UROLOGICAL SUPPLIES) IMPLANT
BSKT STON RTRVL 120 1.9FR (BASKET)
CATH URET 5FR 28IN OPEN ENDED (CATHETERS) ×3 IMPLANT
CATH URET DUAL LUMEN 6-10FR 50 (CATHETERS) IMPLANT
CLOTH BEACON ORANGE TIMEOUT ST (SAFETY) ×3 IMPLANT
FIBER LASER FLEXIVA 1000 (UROLOGICAL SUPPLIES) IMPLANT
FIBER LASER FLEXIVA 365 (UROLOGICAL SUPPLIES) IMPLANT
FIBER LASER FLEXIVA 550 (UROLOGICAL SUPPLIES) IMPLANT
FIBER LASER TRAC TIP (UROLOGICAL SUPPLIES) IMPLANT
GLOVE SURG SS PI 8.0 STRL IVOR (GLOVE) ×9 IMPLANT
GOWN STRL REUS W/TWL XL LVL3 (GOWN DISPOSABLE) ×6 IMPLANT
GUIDEWIRE STR DUAL SENSOR (WIRE) ×3 IMPLANT
IV NS 1000ML (IV SOLUTION) ×2
IV NS 1000ML BAXH (IV SOLUTION) ×1 IMPLANT
IV NS IRRIG 3000ML ARTHROMATIC (IV SOLUTION) IMPLANT
MANIFOLD NEPTUNE II (INSTRUMENTS) ×3 IMPLANT
PACK CYSTO (CUSTOM PROCEDURE TRAY) ×3 IMPLANT
SHEATH ACCESS URETERAL 38CM (SHEATH) IMPLANT
SHEATH URET ACCESS 10/12FR (MISCELLANEOUS) IMPLANT
TUBING CONNECTING 10 (TUBING) ×2 IMPLANT
TUBING CONNECTING 10' (TUBING) ×1

## 2015-12-19 NOTE — Anesthesia Preprocedure Evaluation (Signed)
Anesthesia Evaluation  Patient identified by MRN, date of birth, ID band Patient awake    Reviewed: Allergy & Precautions, NPO status , Patient's Chart, lab work & pertinent test results  Airway Mallampati: II  TM Distance: >3 FB Neck ROM: Full    Dental no notable dental hx.    Pulmonary Current Smoker,    Pulmonary exam normal breath sounds clear to auscultation       Cardiovascular negative cardio ROS Normal cardiovascular exam Rhythm:Regular Rate:Normal     Neuro/Psych negative neurological ROS  negative psych ROS   GI/Hepatic negative GI ROS, Neg liver ROS,   Endo/Other  negative endocrine ROS  Renal/GU negative Renal ROS  negative genitourinary   Musculoskeletal negative musculoskeletal ROS (+)   Abdominal   Peds negative pediatric ROS (+)  Hematology negative hematology ROS (+)   Anesthesia Other Findings   Reproductive/Obstetrics negative OB ROS                             Anesthesia Physical Anesthesia Plan  ASA: II  Anesthesia Plan: General   Post-op Pain Management:    Induction: Intravenous  Airway Management Planned: LMA  Additional Equipment:   Intra-op Plan:   Post-operative Plan:   Informed Consent: I have reviewed the patients History and Physical, chart, labs and discussed the procedure including the risks, benefits and alternatives for the proposed anesthesia with the patient or authorized representative who has indicated his/her understanding and acceptance.   Dental advisory given  Plan Discussed with: CRNA and Surgeon  Anesthesia Plan Comments:         Anesthesia Quick Evaluation  

## 2015-12-19 NOTE — Discharge Instructions (Addendum)
CYSTOSCOPY HOME CARE INSTRUCTIONS  Activity: Rest for the remainder of the day.  Do not drive or operate equipment today.  You may resume normal activities in one to two days as instructed by your physician.   Meals: Drink plenty of liquids and eat light foods such as gelatin or soup this evening.  You may return to a normal meal plan tomorrow.  Return to Work: You may return to work in one to two days or as instructed by your physician.  Special Instructions / Symptoms: Call your physician if any of these symptoms occur:   -persistent or heavy bleeding  -bleeding which continues after first few urination  -large blood clots that are difficult to pass  -urine stream diminishes or stops completely  -fever equal to or higher than 101 degrees Farenheit.  -cloudy urine with a strong, foul odor  -severe pain  Females should always wipe from front to back after elimination.  You may feel some burning pain when you urinate.  This should disappear with time.  Applying moist heat to the lower abdomen or a hot tub bath may help relieve the pain. \  You may want to consider a probiotic for the diarrhea and if doesn't improve after stopping the keflex and with a probiotic after 2-3 days, you should see your primary care physician.   Patient Signature:  ________________________________________________________  Nurse's Signature:  ________________________________________________________

## 2015-12-19 NOTE — H&P (Signed)
CC: I have ureteral stone.  HPI: Barbara Caldwell is a 40 year-old female patient who was referred by Dr. Lacretia Leigh, MD who is here for ureteral stone.  The problem is on the right side. She first stated noticing pain on approximately 12/11/2015. This is her first kidney stone. She is currently having flank pain. She denies having back pain, groin pain, nausea, vomiting, fever, and chills. Pain is occuring on the right side. She has not caught a stone in her urine strainer since her symptoms began.   She has never had surgical treatment for calculi in the past.   Barbara Caldwell is a 40 yo female who was initially seen on 10/19 for a left distal ureteral stone in the ER. The pain abated but returned 8 days ago and a repeat CT on 11/24 shows the stone at the UVJ. She continues to have right flank pain and has urgency and frequency with a sensation of the inability to void. She has had no gross hematuria. She has had nausea and vomiting. She has flushing with the pain. This is her first stone.      ALLERGIES: None   MEDICATIONS: Cephalexin 500 mg capsule  Hydrocodone-Acetaminophen 5 mg-325 mg tablet  Naproxen 375 mg tablet  Ondansetron Hcl 4 mg tablet  Topamax     GU PSH: None   NON-GU PSH: C-Section    GU PMH: None   NON-GU PMH: Migraine, unspecified, not intractable, without status migrainosus    FAMILY HISTORY: Cancer - Aunt copd - Father Hypertension - Father, Mother Kidney Stones - Sister   SOCIAL HISTORY: Marital Status: Single Current Smoking Status: Patient does not smoke anymore.  Social Drinker.  Drinks 2 caffeinated drinks per day.     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 Patient denies leakage of urine, trouble starting your stream, have to strain to urinate, and currently  pregnant.  Gastrointestinal (Upper):   Patient reports nausea and vomiting. Patient denies indigestion/ heartburn.  Gastrointestinal (Lower):   Patient reports diarrhea. Patient denies constipation.  Constitutional:   Patient reports night sweats and fatigue. Patient denies fever and weight loss.  Skin:   Patient denies skin rash/ lesion and itching.  Eyes:   Patient denies blurred vision and double vision.  Ears/ Nose/ Throat:   Patient denies sore throat and sinus problems.  Hematologic/Lymphatic:   Patient denies swollen glands and easy bruising.  Cardiovascular:   Patient denies leg swelling and chest pains.  Respiratory:   Patient denies cough and shortness of breath.  Endocrine:   Patient denies excessive thirst.  Musculoskeletal:   Patient denies back pain and joint pain.  Neurological:   Patient denies headaches and dizziness.  Psychologic:   Patient denies depression and anxiety.   VITAL SIGNS:      12/19/2015 04:33 PM  Weight 138 lb / 62.6 kg  Height 61 in / 154.94 cm  BP 134/86 mmHg  Pulse 74 /min  Temperature 98.2 F / 37 C  BMI 26.1 kg/m   MULTI-SYSTEM PHYSICAL EXAMINATION:    Constitutional: Well-nourished. No physical deformities. Normally developed. Good grooming.  Neck: Neck symmetrical, not swollen. Normal tracheal position.  Respiratory: No labored breathing, no use of accessory muscles. CTA  Cardiovascular: RRR without murmur.  Lymphatic: No enlargement, no tenderness of  axillae,supraclavicular, neck lymph nodes.  Skin: No paleness, no jaundice, no cyanosis. No lesion, no ulcer, no rash.  Neurologic / Psychiatric: Oriented to time, oriented to place, oriented to person. No depression, no anxiety, no agitation.  Gastrointestinal: Abdominal tenderness RLQ and RCV. No mass, no rigidity, non obese abdomen.   Musculoskeletal: Normal gait and station of head and neck.     PAST DATA REVIEWED:  Source Of History:  Patient  Records Review:   Previous Hospital Records   Urine Test Review:   Urinalysis  X-Ray Review: C.T. Stone Protocol: Reviewed Films. 48m right UVJ stone. C.T. Abdomen/Pelvis: Reviewed Films.    Notes:                     ER notes and labs reviewed.    PROCEDURES: None   ASSESSMENT:      ICD-10 Details  1 GU:   Calculus Ureter - N20.1 Right   PLAN:           Schedule Return Visit: ASAP - Schedule Surgery  Procedure: 12/19/2015 - Cysto Uretero Lithotripsy - [phone]         Document Letter(s):  Created for Patient: Clinical Summary         Notes:   She has persistent symptoms from a right UVJ stone and she has been dealing with this stone since mid October.   I reviewed the options of MET and ureteroscopy and she wants to go ahead with ureteroscopy. I have reviewed the risks of bleeding, infection, ureteral injury, need for a stent or secondary procedures, thrombotic events and anesthetic complications. She will be done tonight

## 2015-12-19 NOTE — Anesthesia Postprocedure Evaluation (Signed)
Anesthesia Post Note  Patient: Barbara Caldwell  Procedure(s) Performed: Procedure(s) (LRB): CYSTOSCOPY AND RETROGRADE (Right)  Patient location during evaluation: PACU Anesthesia Type: General Level of consciousness: awake and alert Pain management: pain level controlled Vital Signs Assessment: post-procedure vital signs reviewed and stable Respiratory status: spontaneous breathing, nonlabored ventilation, respiratory function stable and patient connected to nasal cannula oxygen Cardiovascular status: blood pressure returned to baseline and stable Postop Assessment: no signs of nausea or vomiting Anesthetic complications: no    Last Vitals:  Vitals:   12/19/15 2210 12/19/15 2215  BP: (!) 150/104 (!) 142/95  Pulse: (!) 102   Resp: 20   Temp: 36.6 C     Last Pain:  Vitals:   12/19/15 1728  TempSrc: Oral                 Lua Feng S

## 2015-12-19 NOTE — Brief Op Note (Signed)
12/19/2015  9:56 PM  PATIENT:  Barbara Caldwell  40 y.o. female  PRE-OPERATIVE DIAGNOSIS:  right distal stone  POST-OPERATIVE DIAGNOSIS: stone had passed.  PROCEDURE:  Procedure(s): CYSTOSCOPY AND RETROGRADE (Right)  SURGEON:  Surgeon(s) and Role:    * Bjorn PippinJohn Danissa Rundle, MD - Primary  PHYSICIAN ASSISTANT:   ASSISTANTS: none   ANESTHESIA:   general  EBL:  No intake/output data recorded.  BLOOD ADMINISTERED:none  DRAINS: none   LOCAL MEDICATIONS USED:  NONE  SPECIMEN:  No Specimen  DISPOSITION OF SPECIMEN:  N/A  COUNTS:  YES  TOURNIQUET:  * No tourniquets in log *  DICTATION: .Other Dictation: Dictation Number [id number]  PLAN OF CARE: Discharge to home after PACU  PATIENT DISPOSITION:  PACU - hemodynamically stable.   Delay start of Pharmacological VTE agent (>24hrs) due to surgical blood loss or risk of bleeding: not applicable

## 2015-12-19 NOTE — Anesthesia Procedure Notes (Signed)
Procedure Name: LMA Insertion Date/Time: 12/19/2015 9:39 PM Performed by: Anastasio ChampionEVANS, Melvena Vink E Pre-anesthesia Checklist: Patient identified, Emergency Drugs available, Suction available and Patient being monitored Patient Re-evaluated:Patient Re-evaluated prior to inductionOxygen Delivery Method: Circle system utilized Preoxygenation: Pre-oxygenation with 100% oxygen Intubation Type: IV induction Ventilation: Mask ventilation without difficulty LMA: LMA with gastric port inserted LMA Size: 4.0 Tube type: Oral Number of attempts: 1 Placement Confirmation: positive ETCO2 Tube secured with: Tape Dental Injury: Teeth and Oropharynx as per pre-operative assessment

## 2015-12-19 NOTE — Transfer of Care (Signed)
Immediate Anesthesia Transfer of Care Note  Patient: Barbara Caldwell  Procedure(s) Performed: Procedure(s): CYSTOSCOPY AND RETROGRADE (Right)  Patient Location: PACU  Anesthesia Type:General  Level of Consciousness: awake, alert , oriented and patient cooperative  Airway & Oxygen Therapy: Patient Spontanous Breathing and Patient connected to face mask oxygen  Post-op Assessment: Report given to RN, Post -op Vital signs reviewed and stable and Patient moving all extremities X 4  Post vital signs: stable  Last Vitals:  Vitals:   12/19/15 2210 12/19/15 2215  BP: (!) 150/104 (!) 142/95  Pulse: (!) 102   Resp: 20   Temp: 36.6 C     Last Pain:  Vitals:   12/19/15 1728  TempSrc: Oral         Complications: No apparent anesthesia complications

## 2015-12-20 ENCOUNTER — Encounter (HOSPITAL_COMMUNITY): Payer: Self-pay | Admitting: Urology

## 2015-12-20 NOTE — Progress Notes (Signed)
PACU Nursing Note: Patient ready for discharge to home, pt alert and oriented, VSS, mae x 4, tolerating PO fluids, voids without difficulty, able to ambulate without assistance, cont to deny any pain. DC instructions from MD and anesthesia reviewed with pt... (1) importance of handwashing before and after using restroom (2) to make and keep follow up appointment with MD office as outlined in instructions from MD (3) times to call MD prior to appt, I.e. unreleived pain, unable to void, fever etc No rx written by MD for pt to review, discussed also anesthesia dc instructions of rest, and post anesthesia diet and to not drive as well for 24 hours. Opportunity for questions provided. Pt escorted to vehicle, family member here to drive patient home and to be with pt for the next 24 hours.

## 2015-12-21 NOTE — Op Note (Signed)
NAMRicharda Overlie:  Ring, Aneita                ACCOUNT NO.:  [id number]  MEDICAL RECORD NO.:  [id number]  LOCATION:                                 FACILITY:  PHYSICIAN:  Excell SeltzerJohn J. Annabell HowellsWrenn, M.D.    DATE OF BIRTH:  Dec 15, 1975  DATE OF PROCEDURE:  12/19/2015 DATE OF DISCHARGE:                              OPERATIVE REPORT   PROCEDURE:  Cystoscopy with right retrograde pyelogram and interpretation.  PREOPERATIVE DIAGNOSIS:  Right distal ureteral stone.  POSTOPERATIVE DIAGNOSIS:  Right distal ureteral stone with interval passage of stone.  SURGEON:  Excell SeltzerJohn J. Annabell HowellsWrenn, M.D.  ANESTHESIA:  General.  SPECIMEN:  None.  DRAINS:  None.  BLOOD LOSS:  None.  COMPLICATIONS:  None.  INDICATIONS:  Ms. [name] is a 40 year old African American female, who presented to the office today with a history of a right distal ureteral stone.  She has had a slow progression of the stone with a CT in mid October that showed it in the midportion of the distal ureter and a CT scan a few days ago that showed at the UVJ.  Her stone was only about 500 [place] and because of persistent significant pain and irritative voiding symptoms, it was felt that ureteroscopy was indicated.  FINDINGS AND PROCEDURE:  She was taken to the operating room where she was given Ancef.  General anesthetic was induced.  She was placed in lithotomy position.  A B and O suppository were placed.  She was prepped with Betadine solution and draped in usual sterile fashion.  Cystoscopy was performed using a 23-French scope and 30-degree lens. Examination revealed a normal urethra.  The bladder wall was smooth and pale without tumor, stones, or inflammation.  The ureteral orifices were unremarkable.  The right ureteral orifice was actually just a little wide, but there was no edema or erythema, but it did efflux clear urine.  A 5-French open-end catheter was passed and flushed with contrast.  It was then placed in the right ureteral orifice and  contrast was instilled.  There were no filling defects noted and no hydronephrosis and review of her prior CT scan revealed no other stones other than one in the distal ureter.  So at this point, I felt she had passed her stone. Upon removal of the open-end catheter, the kidney decompressed rapidly. The bladder was drained.  The patient was taken down from lithotomy position.  Her anesthetic was reversed.  She was moved to recovery room in stable condition.  There were no complications.     Excell SeltzerJohn J. Annabell HowellsWrenn, M.D.   ______________________________ Excell SeltzerJohn J. Annabell HowellsWrenn, M.D.    JJW/MEDQ  D:  12/19/2015  T:  12/20/2015  Job:  [id number]

## 2016-06-27 ENCOUNTER — Telehealth: Payer: Self-pay | Admitting: Neurology

## 2016-06-27 MED ORDER — TOPIRAMATE 25 MG PO TABS: 100.0000 mg | ORAL_TABLET | Freq: Two times a day (BID) | ORAL | 3 refills | Status: AC

## 2016-06-27 NOTE — Telephone Encounter (Signed)
Patient called office in reference to topiramate (TOPAMAX) 100 MG tablet per patient medication makes her feel loopy, out of it, off and patient can tell she is not herself, hands start to shake.  Patient feels the strength is to high.  Please call

## 2016-06-27 NOTE — Addendum Note (Signed)
Addended by: York SpanielWILLIS, Yaretzy Olazabal K on: 06/27/2016 05:06 PM   Modules accepted: Orders

## 2016-06-27 NOTE — Telephone Encounter (Signed)
I called the patient. The patient has been on 100 mg Topamax for greater than a year tolerating the medication well. What has changed is that she was taking 4 of the 25 mg tablets at night and then just recently switched to the 100 mg tablets taking one at night. I would wonder if there is a different generic manufacturer involved, the patient may be absorbing more medication.  I will switch patient back to taking 4 of the 25 mg tablets at night.

## 2016-09-10 ENCOUNTER — Ambulatory Visit (INDEPENDENT_AMBULATORY_CARE_PROVIDER_SITE_OTHER): Payer: Medicaid Other | Admitting: Adult Health

## 2016-09-10 ENCOUNTER — Encounter: Payer: Self-pay | Admitting: Adult Health

## 2016-09-10 VITALS — BP 111/81 | HR 70 | Wt 128.4 lb

## 2016-09-10 DIAGNOSIS — G43009 Migraine without aura, not intractable, without status migrainosus: Secondary | ICD-10-CM | POA: Diagnosis not present

## 2016-09-10 NOTE — Progress Notes (Signed)
I have read the note, and I agree with the clinical assessment and plan.  Barbara Caldwell,Barbara Caldwell   

## 2016-09-10 NOTE — Progress Notes (Signed)
PATIENT: Barbara Caldwell DOB: [birth year]  REASON FOR VISIT: follow up- migraine HISTORY FROM: patient  HISTORY OF PRESENT ILLNESS: Today 09/10/16 Barbara Caldwell is a 41 year old female with a history of migraine headaches. She returns today for follow-up. She is currently on Topamax 100 mg daily. She reports that she continues to have 2-3 migraines a month. Her headaches always occur on the right side. She does have photophobia as well as nausea. She states with her headaches the pain never exceeds a 7 out of 10 on the pain scale. She typically can lay down and her headache will resolve 15-20 minutes. She reports that she is no longer taking carbamazepine. She states that she has not been waking up from sleep with the headache as she was before. She still has the prescription in case the symptoms return. She states that she does have some tingling in the fingertips from Topamax. She reports in October 2017 she had a kidney stone measuring 6 mm. She had to have this removed. She has not developed any kidney stone since then. She states that she never had had a kidney stone prior to Topamax. She returns today for an evaluation.  HISTORY 09/08/15: Barbara Caldwell is a 41 year old right-handed black female with a history of migraine headaches. The patient has done well with Topamax, currently on 100 mg daily. The patient has had Imitrex to take in the past, but she has not needed this medication in quite some time. She generally will take ibuprofen with the headache comes on. The patient is in school this time, she has been under some stress with this, and she has had 3 minor headaches within the last month. The patient takes carbamazepine for onset of sharp shooting pains on the right side of the face that would generally occur at night. The patient is doing better with these pains on low-dose carbamazepine. The patient overall is functioning very well. She returns for an evaluation.   REVIEW OF SYSTEMS: Out of a  complete 14 system review of symptoms, the patient complains only of the following symptoms, and all other reviewed systems are negative.  Frequency of urination, snoring, headache, runny nose  ALLERGIES: No Known Allergies  HOME MEDICATIONS: Outpatient Medications Prior to Visit  Medication Sig Dispense Refill  . carbamazepine (TEGRETOL) 200 MG tablet Take 1 tablet (200 mg total) by mouth at bedtime. (Patient taking differently: Take 200 mg by mouth at bedtime as needed (nerve pain). ) 90 tablet 3  . HYDROcodone-acetaminophen (NORCO/VICODIN) 5-325 MG tablet Take 1-2 tablets by mouth every 4 (four) hours as needed for severe pain. 12 tablet 0  . ibuprofen (ADVIL,MOTRIN) 200 MG tablet Take 600 mg by mouth every 6 (six) hours as needed for headache, mild pain or moderate pain.     Marland Kitchen ondansetron (ZOFRAN ODT) 4 MG disintegrating tablet Take 1 tablet (4 mg total) by mouth every 8 (eight) hours as needed for nausea or vomiting. 20 tablet 0  . ondansetron (ZOFRAN) 4 MG tablet Take 1 tablet (4 mg total) by mouth every 8 (eight) hours as needed for nausea or vomiting. 12 tablet 0  . oxyCODONE-acetaminophen (PERCOCET/ROXICET) 5-325 MG tablet Take 1 tablet by mouth every 6 (six) hours as needed for severe pain. 15 tablet 0  . topiramate (TOPAMAX) 25 MG tablet Take 4 tablets (100 mg total) by mouth 2 (two) times daily. 360 tablet 3   No facility-administered medications prior to visit.     PAST MEDICAL HISTORY: Past Medical History:  Diagnosis Date  . Headache   . History of kidney stones   . Migraine without aura, with intractable migraine, so stated, without mention of status migrainosus 04/06/2013    PAST SURGICAL HISTORY: Past Surgical History:  Procedure Laterality Date  . CESAREAN SECTION  2004  . CYSTOSCOPY WITH RETROGRADE PYELOGRAM, URETEROSCOPY AND STENT PLACEMENT Right 12/19/2015   Procedure: CYSTOSCOPY AND RETROGRADE;  Surgeon: Bjorn Pippin, MD;  Location: [facility];  Service: Urology;   Laterality: Right;    FAMILY HISTORY: Family History  Problem Relation Age of Onset  . Hypertension Mother   . Cataracts Mother   . Hypertension Father   . Gout Father   . Heart disease Father   . Migraines Cousin     SOCIAL HISTORY: Social History   Social History  . Marital status: Legally Separated    Spouse name: N/A  . Number of children: 4  . Years of education: college 1   Occupational History  . Student    Social History Main Topics  . Smoking status: Current Every Day Smoker    Packs/day: 0.20    Years: 1.00    Types: Cigars  . Smokeless tobacco: Never Used  . Alcohol use 0.0 oz/week     Comment: daily  . Drug use: No  . Sexual activity: Yes    Birth control/ protection: Condom   Other Topics Concern  . Not on file   Social History Narrative   Lives at home w/ her daughter   Right-handed   Caffeine: rare         PHYSICAL EXAM  Vitals:   09/10/16 1139  BP: 111/81  Pulse: 70  Weight: 128 lb 6.4 oz (58.2 kg)   Body mass index is 24.26 kg/m.  Generalized: Well developed, in no acute distress   Neurological examination  Mentation: Alert oriented to time, place, history taking. Follows all commands speech and language fluent Cranial nerve II-XII: Pupils were equal round reactive to light. Extraocular movements were full, visual field were full on confrontational test. Facial sensation and strength were normal. Uvula tongue midline. Head turning and shoulder shrug  were normal and symmetric. Motor: The motor testing reveals 5 over 5 strength of all 4 extremities. Good symmetric motor tone is noted throughout.  Sensory: Sensory testing is intact to soft touch on all 4 extremities. No evidence of extinction is noted.  Coordination: Cerebellar testing reveals good finger-nose-finger and heel-to-shin bilaterally.  Gait and station: Gait is normal. Tandem gait is normal. Romberg is negative. No drift is seen.  Reflexes: Deep tendon reflexes are  symmetric and normal bilaterally.   DIAGNOSTIC DATA (LABS, IMAGING, TESTING) - I reviewed patient records, labs, notes, testing and imaging myself where available.  Lab Results  Component Value Date   WBC 18.5 (H) 12/11/2015   HGB 16.6 (H) 12/11/2015   HCT 47.2 (H) 12/11/2015   MCV 99.6 12/11/2015   PLT 391 12/11/2015      Component Value Date/Time   NA 137 12/11/2015 2110   NA 140 09/08/2015 0839   K 3.6 12/11/2015 2110   CL 108 12/11/2015 2110   CO2 20 (L) 12/11/2015 2110   GLUCOSE 116 (H) 12/11/2015 2110   BUN 11 12/11/2015 2110   BUN 7 09/08/2015 0839   CREATININE 0.88 12/11/2015 2110   CALCIUM 9.5 12/11/2015 2110   PROT 7.5 12/11/2015 2110   PROT 6.4 09/08/2015 0839   ALBUMIN 4.4 12/11/2015 2110   ALBUMIN 4.0 09/08/2015 0839   AST  17 12/11/2015 2110   ALT 7 (L) 12/11/2015 2110   ALKPHOS 62 12/11/2015 2110   BILITOT 1.0 12/11/2015 2110   BILITOT 0.3 09/08/2015 0839   GFRNONAA >60 12/11/2015 2110   GFRAA >60 12/11/2015 2110      ASSESSMENT AND PLAN 41 y.o. year old female  has a past medical history of Headache; History of kidney stones; and Migraine without aura, with intractable migraine, so stated, without mention of status migrainosus (04/06/2013). here with:  1. Migraine headache  The patient's symptoms have been under relatively good control with Topamax. She did develop a kidney stone that had to be removed in October 2017. I advised that if she develops any additional kidney stones we will need to consider stopping Topamax. She voiced understanding. She will follow-up in one year or sooner if needed.  I spent 15 minutes with the patient. 50% of this time was spent discussing her medication Topamax     Butch Penny, MSN, NP-C 09/10/2016, 11:45 AM [facility] [address], [address] [place], [place] [address] [phone]

## 2016-09-10 NOTE — Patient Instructions (Signed)
Your Plan:  Continue Topamax 100 mg daily Please make [place] aware if you get another kidney stone If your symptoms worsen or you develop new symptoms please let us know.    Thank you for coming to see [place] at [facility]. I hope we have been able to provide you high quality care today.  You may receive a patient satisfaction survey over the next few weeks. We would appreciate your feedback and comments so that we may continue to improve ourselves and the health of our patients.

## 2016-10-31 ENCOUNTER — Encounter (HOSPITAL_COMMUNITY): Payer: Self-pay | Admitting: Emergency Medicine

## 2016-10-31 ENCOUNTER — Emergency Department (HOSPITAL_COMMUNITY)
Admission: EM | Admit: 2016-10-31 | Discharge: 2016-10-31 | Disposition: A | Discharge status: Home / Self Care | Payer: Medicaid Other | Attending: Emergency Medicine | Admitting: Emergency Medicine

## 2016-10-31 ENCOUNTER — Emergency Department (HOSPITAL_COMMUNITY): Payer: Medicaid Other

## 2016-10-31 DIAGNOSIS — M62838 Other muscle spasm: Secondary | ICD-10-CM | POA: Insufficient documentation

## 2016-10-31 DIAGNOSIS — Z79899 Other long term (current) drug therapy: Secondary | ICD-10-CM | POA: Diagnosis not present

## 2016-10-31 DIAGNOSIS — R0789 Other chest pain: Secondary | ICD-10-CM | POA: Insufficient documentation

## 2016-10-31 DIAGNOSIS — R079 Chest pain, unspecified: Secondary | ICD-10-CM | POA: Diagnosis present

## 2016-10-31 DIAGNOSIS — F17210 Nicotine dependence, cigarettes, uncomplicated: Secondary | ICD-10-CM | POA: Insufficient documentation

## 2016-10-31 HISTORY — DX: Disorder of kidney and ureter, unspecified: N28.9

## 2016-10-31 LAB — I-STAT TROPONIN, ED: TROPONIN I, POC: 0 ng/mL (ref 0.00–0.08)

## 2016-10-31 MED ORDER — METHOCARBAMOL 500 MG PO TABS: 500.0000 mg | ORAL_TABLET | Freq: Two times a day (BID) | ORAL | 0 refills | Status: AC

## 2016-10-31 MED ORDER — NAPROXEN 500 MG PO TABS: 500.0000 mg | ORAL_TABLET | Freq: Two times a day (BID) | ORAL | 0 refills | Status: AC

## 2016-10-31 MED ORDER — NAPROXEN 500 MG PO TABS
500.0000 mg | ORAL_TABLET | Freq: Once | ORAL | Status: AC
  Administered 2016-10-31: 500 mg via ORAL
  Filled 2016-10-31: qty 1

## 2016-10-31 MED ORDER — METHOCARBAMOL 500 MG PO TABS
1000.0000 mg | ORAL_TABLET | Freq: Once | ORAL | Status: AC
  Administered 2016-10-31: 1000 mg via ORAL
  Filled 2016-10-31: qty 2

## 2016-10-31 NOTE — Discharge Instructions (Signed)
We suspect chest wall pain and muscle spasm as the cause for your pain. Please take the medicine prescribed. See your doctor in 1 week.

## 2016-10-31 NOTE — ED Notes (Signed)
Patient in radiology

## 2016-10-31 NOTE — ED Triage Notes (Signed)
Per pt, states she woke up this am with left shoulder discomfort radiating down to left elbow-states she was doing a lot of paperwork yesterday and thinks it might be related to her posture-no injury, no CP, SOB

## 2016-10-31 NOTE — ED Provider Notes (Signed)
[facility]-EMERGENCY DEPT Provider Note   CSN: [id number] Arrival date & time: 10/31/16  1157     History   Chief Complaint Chief Complaint  Patient presents with  . Shoulder Pain    HPI Barbara Caldwell is a 41 y.o. female.  HPI Patient comes in with chief complaint of chest pain. Patient has no significant medical history. Patient reports that she woke up this morning with left-sided chest pain, shoulder pain. Pain has been persistent since it started. Pain is described as dull and sharp pain that is moving towards her elbow. Pain is worse with certain positions. Patient denies any worsening of the pain with inspiration or exertion. Pain is not worse with shoulder movement. Patient has no history of similar pain. Patient denies any trauma or overexertion.  Past Medical History:  Diagnosis Date  . Headache   . History of kidney stones   . Migraine without aura, with intractable migraine, so stated, without mention of status migrainosus 04/06/2013  . Renal disorder     Patient Active Problem List   Diagnosis Date Noted  . Migraine without aura and without status migrainosus, not intractable 09/08/2015  . Migraine without aura, with intractable migraine, so stated, without mention of status migrainosus 04/06/2013    Past Surgical History:  Procedure Laterality Date  . CESAREAN SECTION  2004  . CYSTOSCOPY WITH RETROGRADE PYELOGRAM, URETEROSCOPY AND STENT PLACEMENT Right 12/19/2015   Procedure: CYSTOSCOPY AND RETROGRADE;  Surgeon: Bjorn Pippin, MD;  Location: [facility];  Service: Urology;  Laterality: Right;    OB History    No data available       Home Medications    Prior to Admission medications   Medication Sig Start Date End Date Taking? Authorizing Provider  HYDROcodone-acetaminophen (NORCO/VICODIN) 5-325 MG tablet Take 1-2 tablets by mouth every 4 (four) hours as needed for severe pain. 12/12/15   Shaune Pollack, MD  ibuprofen (ADVIL,MOTRIN) 200 MG tablet Take 600 mg by  mouth every 6 (six) hours as needed for headache, mild pain or moderate pain.     [provider]  methocarbamol (ROBAXIN) 500 MG tablet Take 1 tablet (500 mg total) by mouth 2 (two) times daily. 10/31/16   Derwood Kaplan, MD  naproxen (NAPROSYN) 500 MG tablet Take 1 tablet (500 mg total) by mouth 2 (two) times daily with a meal. 10/31/16   Tierre Netto, MD  ondansetron (ZOFRAN ODT) 4 MG disintegrating tablet Take 1 tablet (4 mg total) by mouth every 8 (eight) hours as needed for nausea or vomiting. 11/03/15   Ward, Chase Picket, PA-C  ondansetron (ZOFRAN) 4 MG tablet Take 1 tablet (4 mg total) by mouth every 8 (eight) hours as needed for nausea or vomiting. 12/12/15   Shaune Pollack, MD  oxyCODONE-acetaminophen (PERCOCET/ROXICET) 5-325 MG tablet Take 1 tablet by mouth every 6 (six) hours as needed for severe pain. 11/03/15   Ward, Chase Picket, PA-C  topiramate (TOPAMAX) 25 MG tablet Take 4 tablets (100 mg total) by mouth 2 (two) times daily. 06/27/16   York Spaniel, MD    Family History Family History  Problem Relation Age of Onset  . Hypertension Mother   . Cataracts Mother   . Hypertension Father   . Gout Father   . Heart disease Father   . Migraines Cousin     Social History Social History  Substance Use Topics  . Smoking status: Current Every Day Smoker    Packs/day: 0.20    Years: 1.00    Types:  Cigars  . Smokeless tobacco: Never Used  . Alcohol use 0.0 oz/week     Comment: daily     Allergies   Patient has no known allergies.   Review of Systems Review of Systems  Constitutional: Positive for activity change. Negative for diaphoresis.  Respiratory: Negative for cough and shortness of breath.   Cardiovascular: Positive for chest pain.  Gastrointestinal: Negative for nausea.  Musculoskeletal: Negative for back pain.     Physical Exam Updated Vital Signs BP (!) 145/86 (BP Location: Right Arm)   Pulse 85   Temp 97.6 F (36.4 C) (Oral)   Resp  20   SpO2 98%   Physical Exam  Constitutional: She is oriented to person, place, and time. She appears well-developed.  HENT:  Head: Normocephalic and atraumatic.  Eyes: EOM are normal.  Neck: Normal range of motion. Neck supple.  Cardiovascular: Normal rate.   Pulmonary/Chest: Effort normal. She exhibits tenderness.  Patient notes chest wall tenderness with auscultation  Abdominal: Bowel sounds are normal.  Musculoskeletal: She exhibits no edema, tenderness or deformity.  Patient has no tenderness with shoulder range of motion. Patient does have tenderness over the scapular region with palpation that is reproducible.   Neurological: She is alert and oriented to person, place, and time.  Skin: Skin is warm and dry.  Nursing note and vitals reviewed.    ED Treatments / Results  Labs (all labs ordered are listed, but only abnormal results are displayed) Labs Reviewed  I-STAT TROPONIN, ED    EKG  EKG Interpretation  Date/Time:  Wednesday October 31 2016 16:43:06 EDT Ventricular Rate:  81 PR Interval:    QRS Duration: 83 QT Interval:  393 QTC Calculation: 457 R Axis:   112 Text Interpretation:  Sinus rhythm Right axis deviation No acute changes No old tracing to compare Confirmed by Derwood Kaplan [phone]) on 10/31/2016 5:20:14 PM       Radiology Dg Chest 2 View  Result Date: 10/31/2016 CLINICAL DATA:  Left side chest pain EXAM: CHEST  2 VIEW COMPARISON:  None. FINDINGS: Mild peribronchial thickening. Heart and mediastinal contours are within normal limits. No focal opacities or effusions. No acute bony abnormality. IMPRESSION: Mild bronchitic changes. Electronically Signed   By: Charlett Nose M.D.   On: 10/31/2016 17:40    Procedures Procedures (including critical care time)  Medications Ordered in ED Medications  naproxen (NAPROSYN) tablet 500 mg (500 mg Oral Given 10/31/16 1724)  methocarbamol (ROBAXIN) tablet 1,000 mg (1,000 mg Oral Given 10/31/16 1725)      Initial Impression / Assessment and Plan / ED Course  I have reviewed the triage vital signs and the nursing notes.  Pertinent labs & imaging results that were available during my care of the patient were reviewed by me and considered in my medical decision making (see chart for details).     Patient comes in with chief complaint of chest pain left-sided that is radiating towards her arm. On exam it is noted that patient has reproducible chest wall tenderness during auscultation of the lungs, and she has significant spasming in the scapular region. Patient reports that she woke up with this pain, there is no inciting event. Pain is not typical for ACS, and the HEAR score is 0. Troponin ordered, along with the EKG and is negative. Pain has been constant for one than 6 hours now and with the initial troponin being negative I don't see any utility in repeating EKG given the very low suspicious for  ACS. EKG doesn't show pericarditis, and the pain is not worse with supine position or pleuritic.  Patient advised to follow-up with her primary care doctor. We gave her discharge instructions on muscle spasms, chest wall pain.  Final Clinical Impressions(s) / ED Diagnoses   Final diagnoses:  Chest wall pain  Muscle spasm    New Prescriptions New Prescriptions   METHOCARBAMOL (ROBAXIN) 500 MG TABLET    Take 1 tablet (500 mg total) by mouth 2 (two) times daily.   NAPROXEN (NAPROSYN) 500 MG TABLET    Take 1 tablet (500 mg total) by mouth 2 (two) times daily with a meal.     Derwood Kaplan, MD 10/31/16 1759

## 2017-09-11 ENCOUNTER — Ambulatory Visit: Payer: Medicaid Other | Admitting: Neurology

## 2017-09-25 ENCOUNTER — Ambulatory Visit: Payer: Medicaid Other | Admitting: Adult Health

## 2017-09-26 ENCOUNTER — Encounter: Payer: Self-pay | Admitting: Adult Health

## 2018-08-31 IMAGING — CT CT ABD-PELV W/ CM
2 of 5 series · 16 of 46 positions shown, 18 images · IV contrast (ISOVUE)
Comparison: None.

CLINICAL DATA: 40-year-old female with right abdominal pelvic pain
for 1 day. Nausea and vomiting for 2 hours.

EXAM:
CT ABDOMEN AND PELVIS WITH CONTRAST
TECHNIQUE: Multidetector CT imaging of the abdomen and pelvis was performed
using the standard protocol following bolus administration of
intravenous contrast.
CONTRAST:  100 cc intravenous Isovue 300

[Series 2: abd/pel with · axial · 0.65mm/px · z∈[+670,+1025]mm · 13 of 83 slices shown, 15 images]
[im 6/83  soft-tissue]
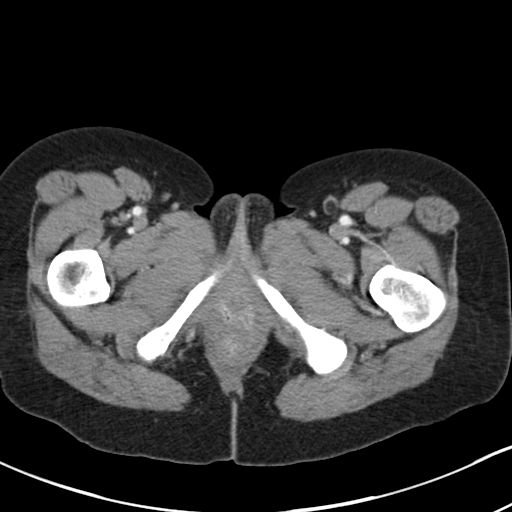
[im 6/83  bone]
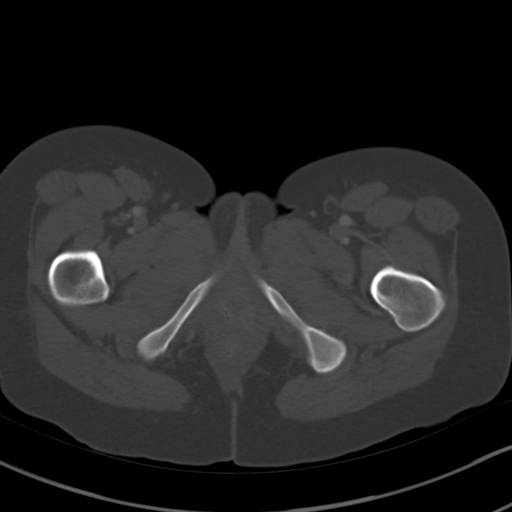
[im 11/83  soft-tissue]
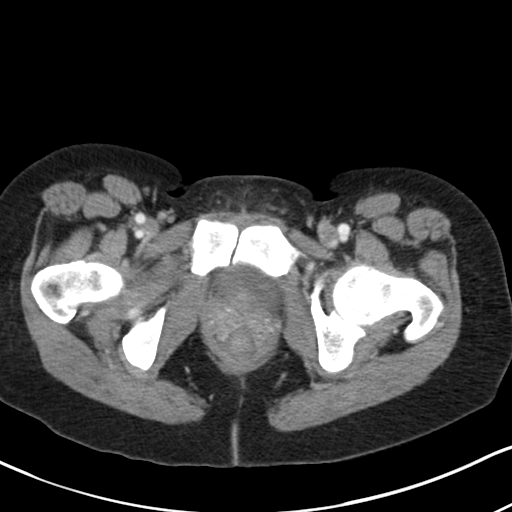
[im 17/83  soft-tissue]
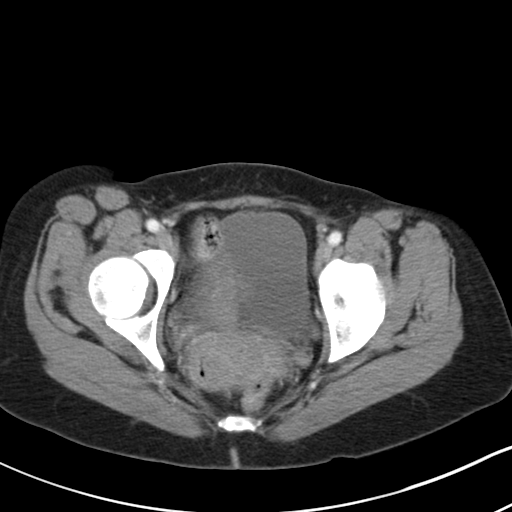
[im 22/83  soft-tissue]
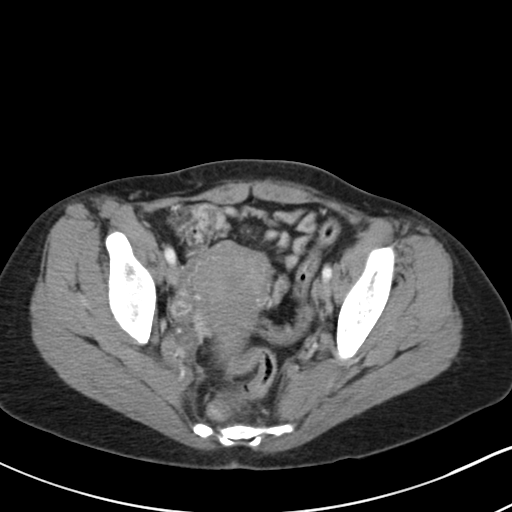
[im 28/83  soft-tissue]
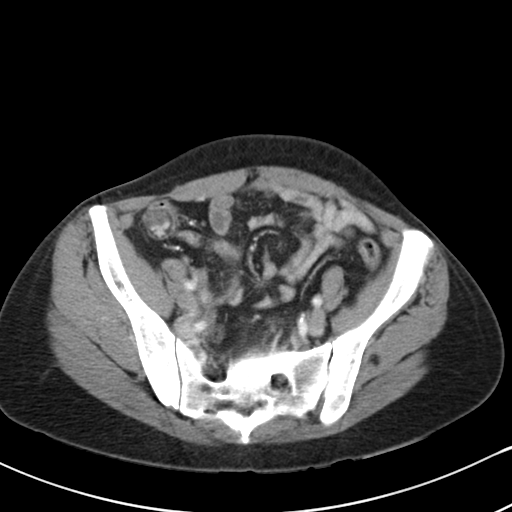
[im 33/83  soft-tissue]
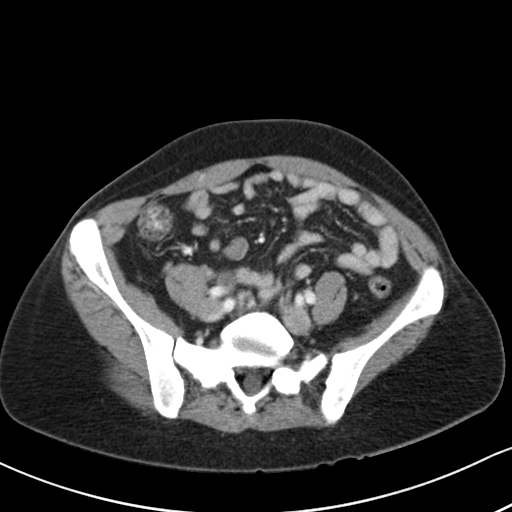
[im 44/83  soft-tissue]
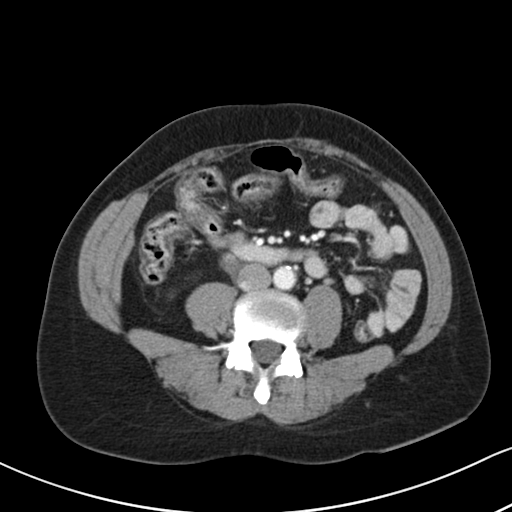
[im 50/83  soft-tissue]
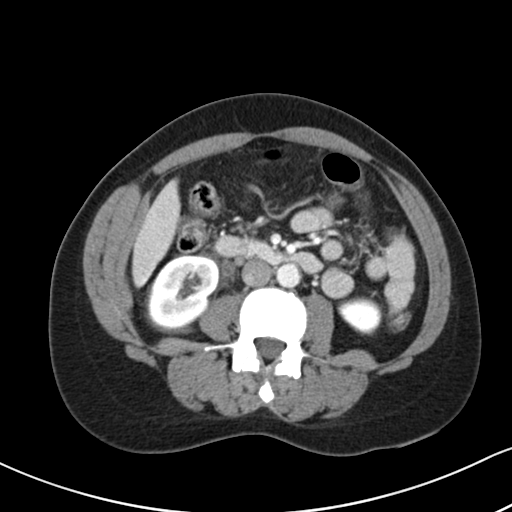
[im 55/83  soft-tissue]
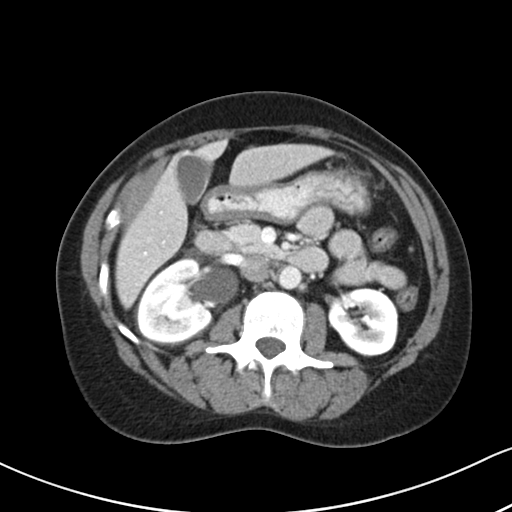
[im 55/83  bone]
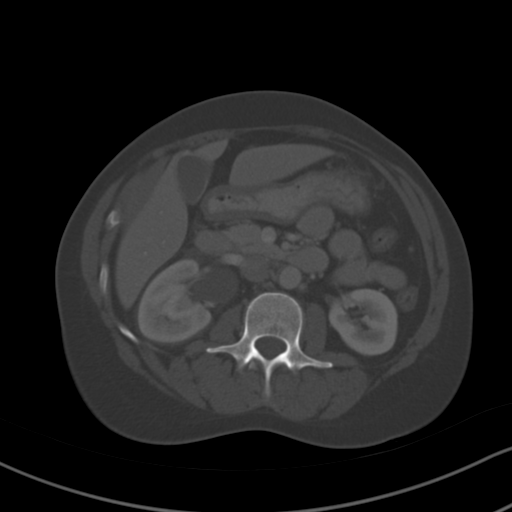
[im 61/83  soft-tissue]
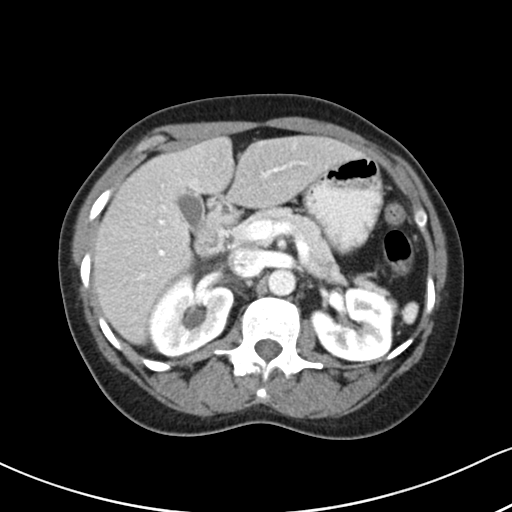
[im 66/83  soft-tissue]
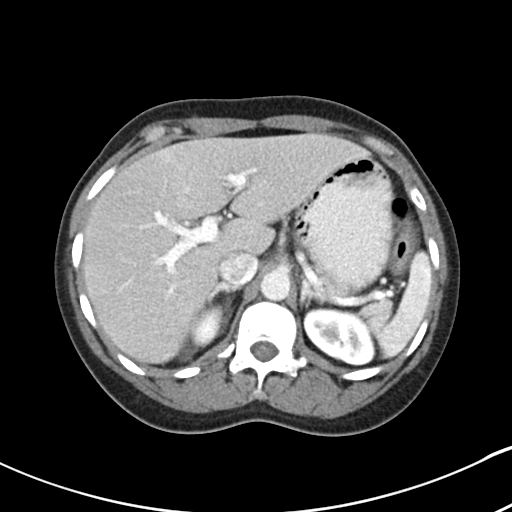
[im 72/83  soft-tissue]
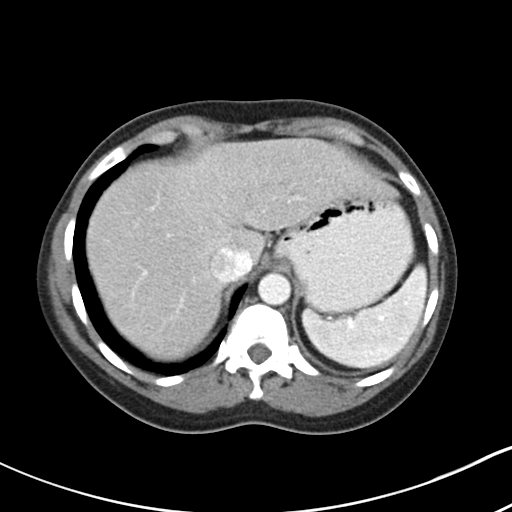
[im 77/83  soft-tissue]
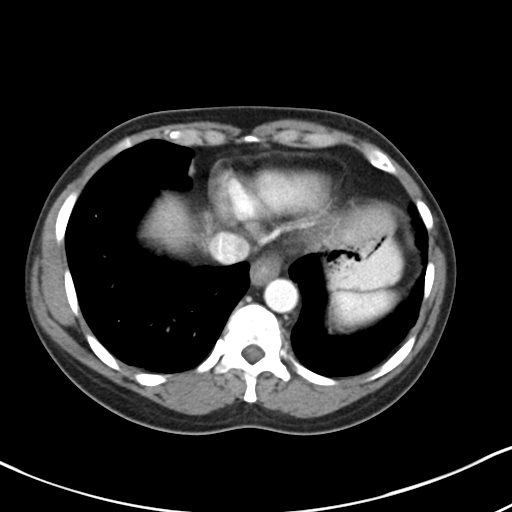

[Series 3: coronal a/|p · coronal · 0.71mm/px · 3 of 127 slices shown]
[im 43/127  soft-tissue]
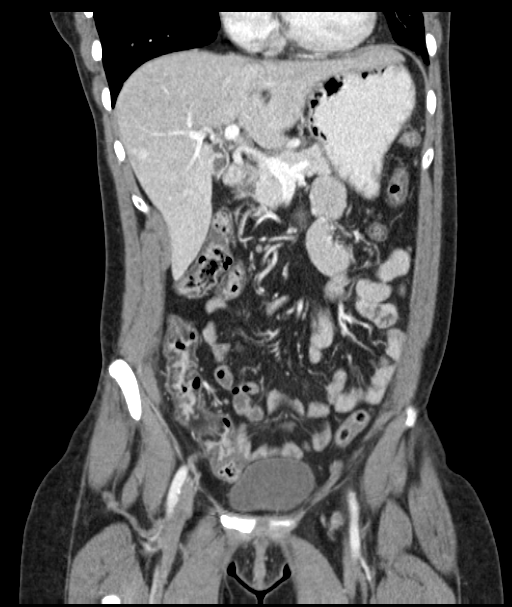
[im 57/127  soft-tissue]
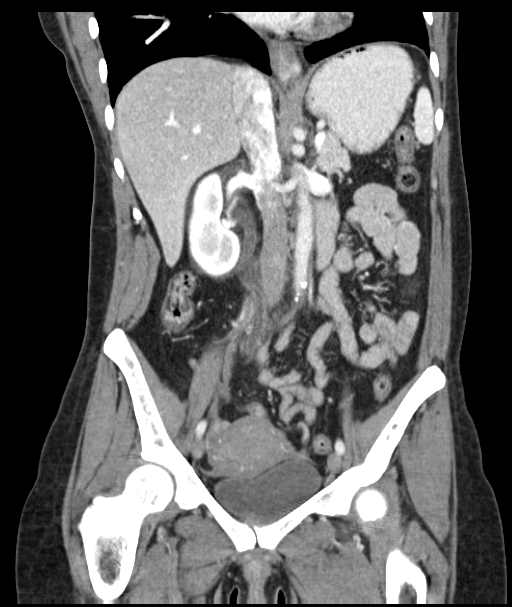
[im 71/127  soft-tissue]
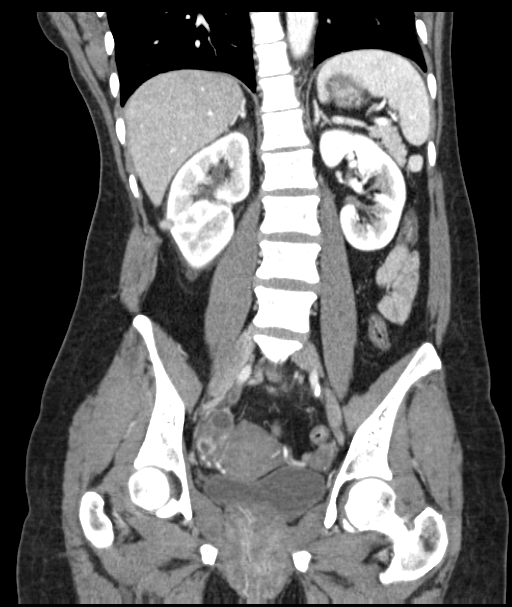

[16 of 46 positions shown; findings below may reference images not displayed]

FINDINGS: Lower chest: Mild bibasilar atelectasis noted.

Hepatobiliary: The liver and gallbladder are unremarkable. There is
no evidence of biliary dilatation.

Pancreas: Unremarkable

Spleen: Unremarkable.

Adrenals/Urinary Tract: A 5 mm distal right ureteral calculus (2 cm
above the UVJ) causes moderate right hydroureteronephrosis.

An indeterminate 1.2 cm mass extending off of the lateral mid right
kidney is identified.

A peripheral calcification within the left upper pole with overlying
scar is present.

The adrenal glands and bladder are unremarkable.

Stomach/Bowel: Unremarkable. No bowel obstruction or definite bowel
wall thickening. The appendix is normal.

Vascular/Lymphatic: Mild abdominal aortic atherosclerotic
calcifications noted without aneurysm. No enlarged lymph nodes
identified.

Reproductive: Unremarkable

Other: No free fluid, focal collection or pneumoperitoneum.

Musculoskeletal: No acute or suspicious abnormality. A mild to
moderate apex left lumbar scoliosis is noted.
IMPRESSION: 5 mm distal right ureteral calculus (2 cm above the UVJ) causing
moderate right hydroureteronephrosis.

Indeterminate 1.2 cm right renal mass- elective renal MRI with and
without contrast is recommended as malignancy is not excluded.

Abdominal aortic atherosclerosis.

## 2018-10-08 IMAGING — CT CT RENAL STONE PROTOCOL
2 of 3 series · 16 of 46 positions shown, 18 images · non-contrast
Comparison: CT of the abdomen and pelvis from 11/03/2015

CLINICAL DATA: Acute onset of right flank pain, nausea and
vomiting. Initial encounter.

EXAM:
CT ABDOMEN AND PELVIS WITHOUT CONTRAST
TECHNIQUE: Multidetector CT imaging of the abdomen and pelvis was performed
following the standard protocol without IV contrast.

[Series 3: coronal · coronal · 0.93mm/px · 3 of 115 slices shown]
[im 39/115  soft-tissue]
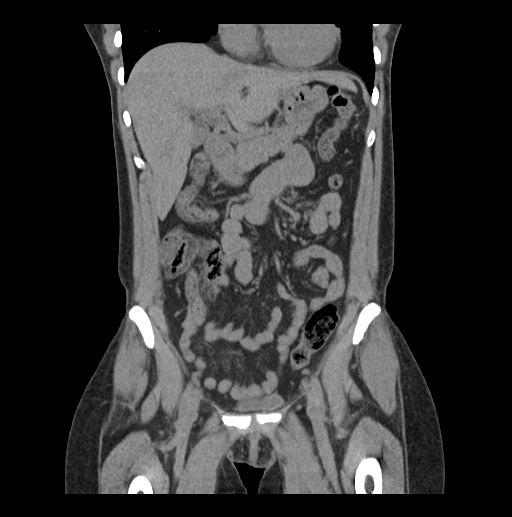
[im 51/115  soft-tissue]
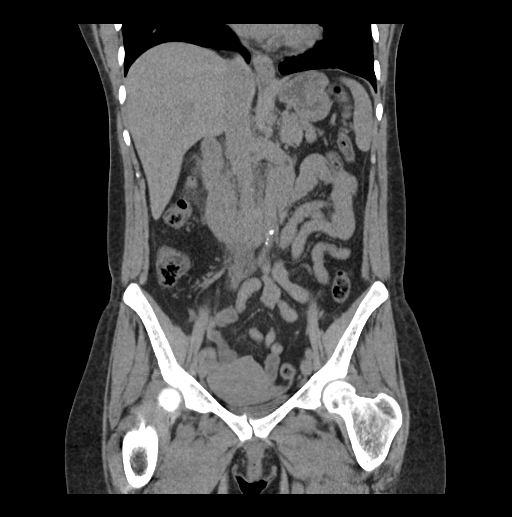
[im 64/115  soft-tissue]
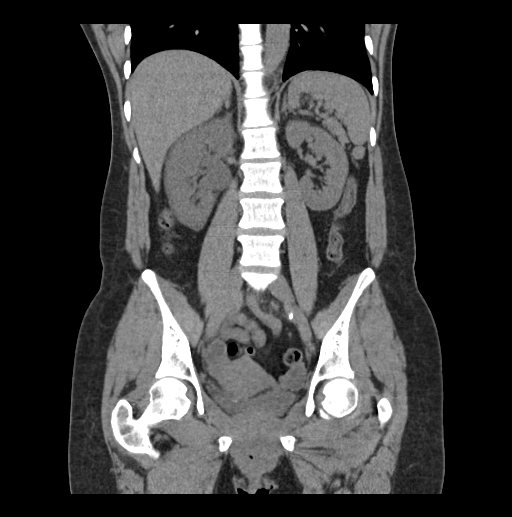

[Series 6: lung · axial · 0.77mm/px · z∈[+150,+240]mm · 13 of 53 slices shown, 15 images]
[im 4/53  soft-tissue]
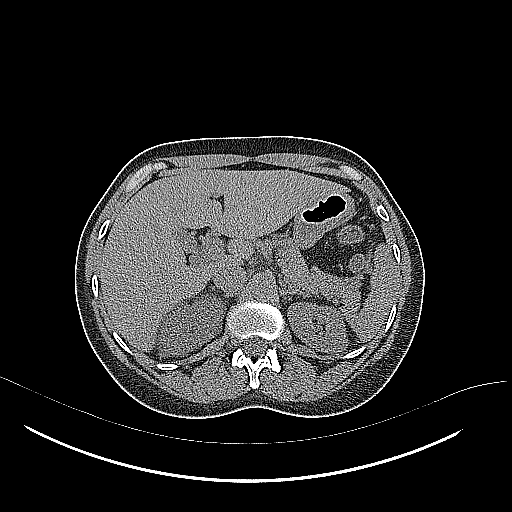
[im 4/53  bone]
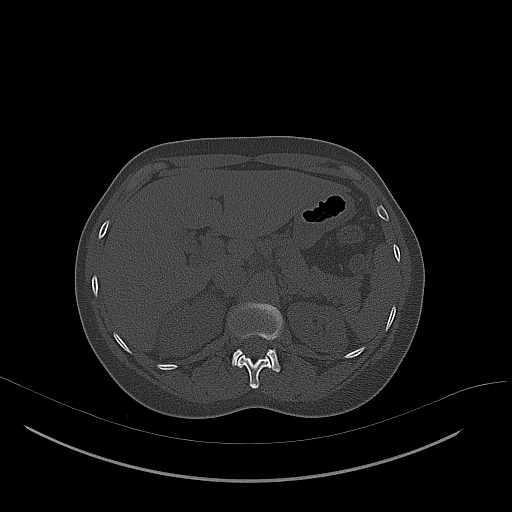
[im 7/53  soft-tissue]
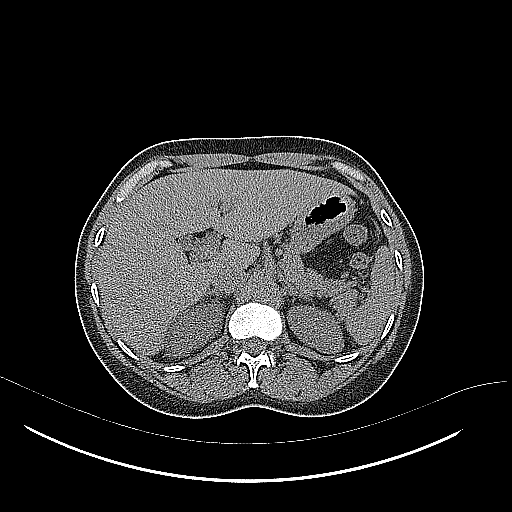
[im 11/53  soft-tissue]
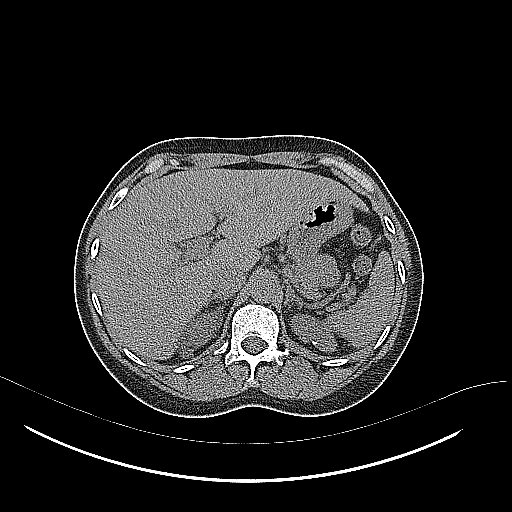
[im 16/53  soft-tissue]
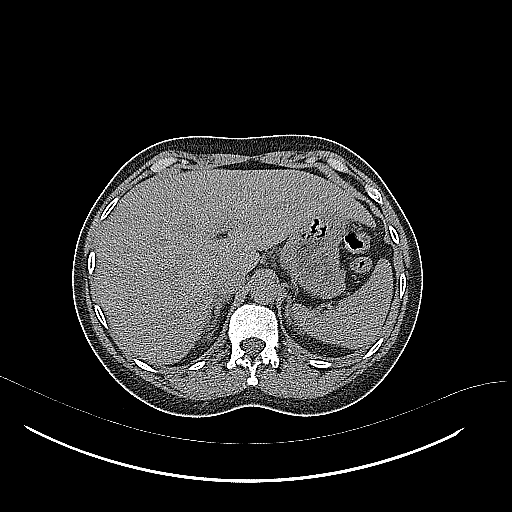
[im 19/53  soft-tissue]
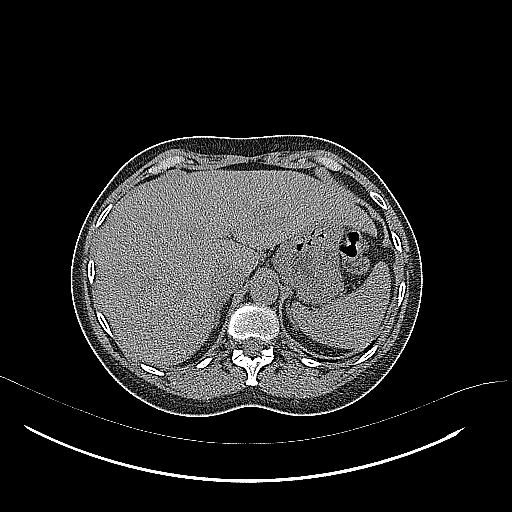
[im 22/53  soft-tissue]
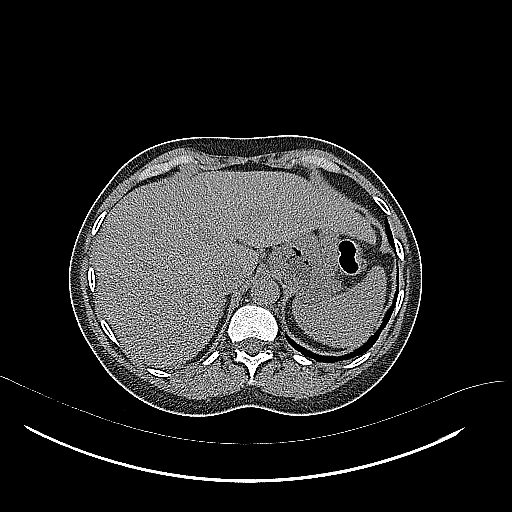
[im 27/53  soft-tissue]
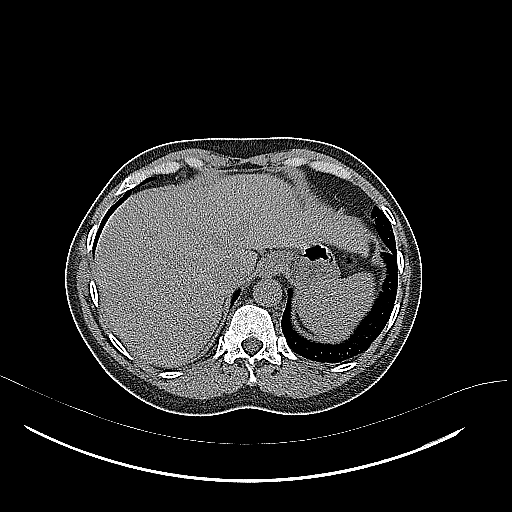
[im 31/53  soft-tissue]
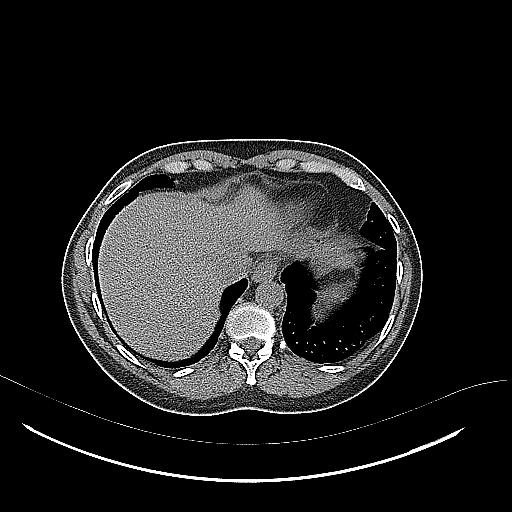
[im 34/53  soft-tissue]
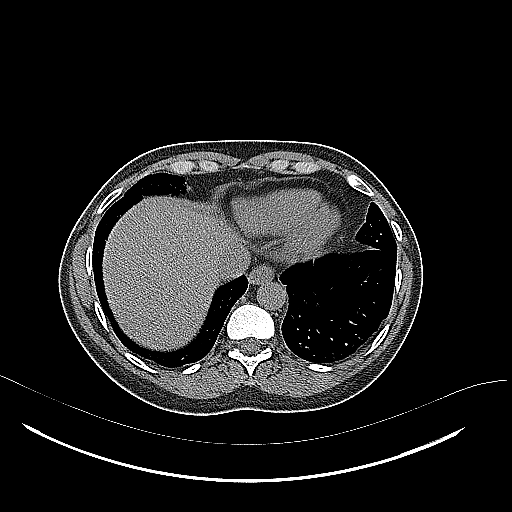
[im 34/53  bone]
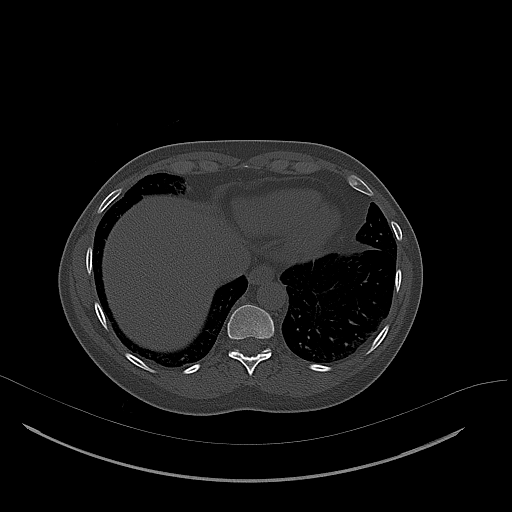
[im 37/53  soft-tissue]
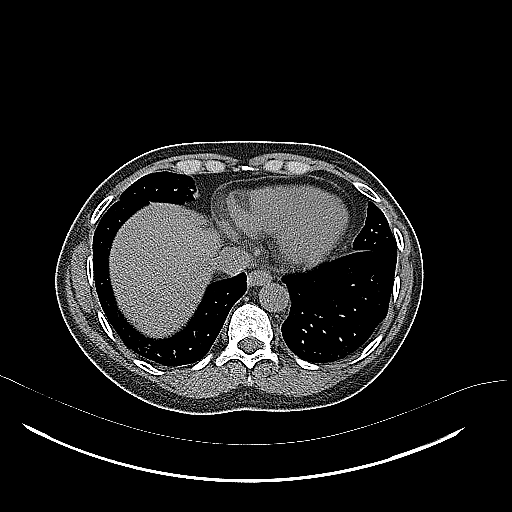
[im 42/53  soft-tissue]
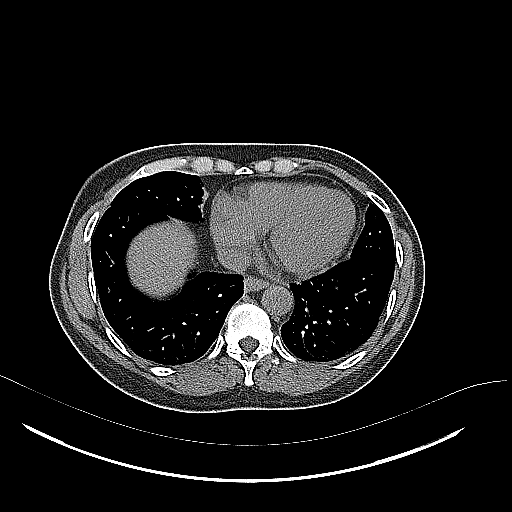
[im 46/53  soft-tissue]
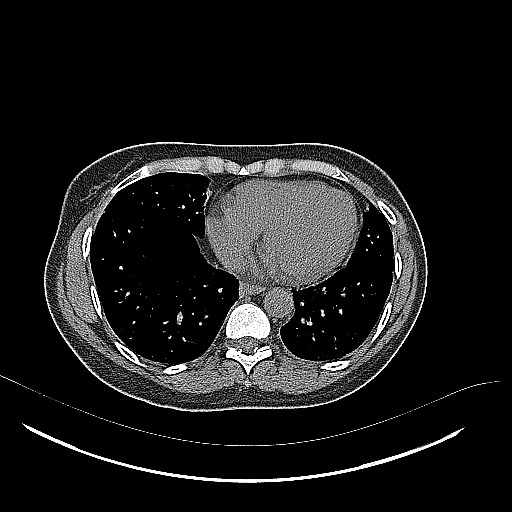
[im 49/53  soft-tissue]
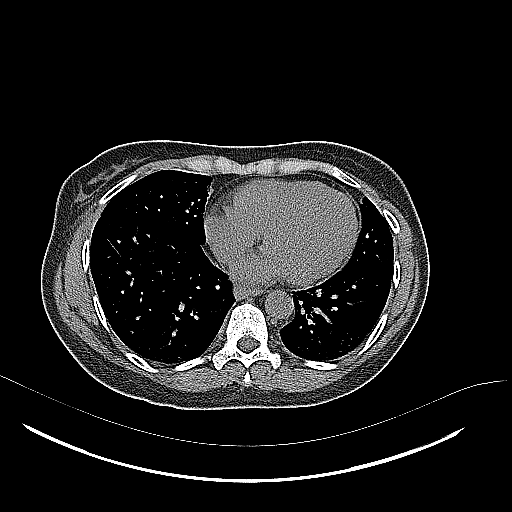

[16 of 46 positions shown; findings below may reference images not displayed]

FINDINGS: Lower chest: Minimal bibasilar atelectasis is noted. The visualized
portions of the mediastinum are unremarkable.

Hepatobiliary: The liver is unremarkable in appearance. The
gallbladder is unremarkable in appearance. The common bile duct
remains normal in caliber.

Pancreas: The pancreas is within normal limits.

Spleen: The spleen is unremarkable in appearance.

Adrenals/Urinary Tract: The adrenal glands are unremarkable in
appearance.

Mild right-sided hydronephrosis is noted, with right-sided
perinephric stranding and fluid, and prominence of the right ureter
along its entire course. An obstructing 6 mm stone is noted distally
at the right vesicoureteral junction.

A parenchymal calcification is noted at the upper pole of the left
kidney, with associated scarring. No nonobstructing renal stones are
identified.

Stomach/Bowel: The stomach is unremarkable in appearance. The small
bowel is within normal limits. The appendix is normal in caliber,
without evidence of appendicitis. The colon is decompressed and
unremarkable in appearance.

Vascular/Lymphatic: Minimal calcification is seen at the distal
abdominal aorta. No retroperitoneal or pelvic sidewall
lymphadenopathy is seen.

Reproductive: The bladder is decompressed and not well assessed. The
uterus is unremarkable in appearance. The ovaries are relatively
symmetric. No suspicious adnexal masses are seen.

Other: No additional soft tissue abnormalities are seen.

Musculoskeletal: No acute osseous abnormalities are identified. The
visualized musculature is unremarkable in appearance.
IMPRESSION: Mild right-sided hydronephrosis, with right-sided perinephric
stranding and fluid. Obstructing 6 mm stone noted distally at the
right vesicoureteral junction.
